# Patient Record
Sex: Male | Born: 1970 | Race: White | Hispanic: No | Smoking: Never smoker
Health system: Southern US, Community
[De-identification: ages and names within clinical notes are randomized; demographics above are authoritative.]

## PROBLEM LIST (undated history)

## (undated) DIAGNOSIS — J45909 Unspecified asthma, uncomplicated: Secondary | ICD-10-CM

## (undated) HISTORY — DX: Unspecified asthma, uncomplicated: J45.909

## (undated) HISTORY — PX: HERNIA REPAIR: SHX51

---

## 2012-10-06 ENCOUNTER — Emergency Department: Payer: Self-pay | Admitting: Emergency Medicine

## 2013-09-17 ENCOUNTER — Ambulatory Visit: Payer: Self-pay | Admitting: General Practice

## 2015-03-25 IMAGING — CR DG RIBS 2V*R*
1 series · 4 of 4 positions shown · non-contrast
Comparison: none

REASON FOR EXAM: pain rt side fax report to Dr. Sanoshi [phone]
COMMENTS:

[Series 1: w ribs ap upper right · 0.14mm/px · 4 of 4 slices shown]
[im 1/4]
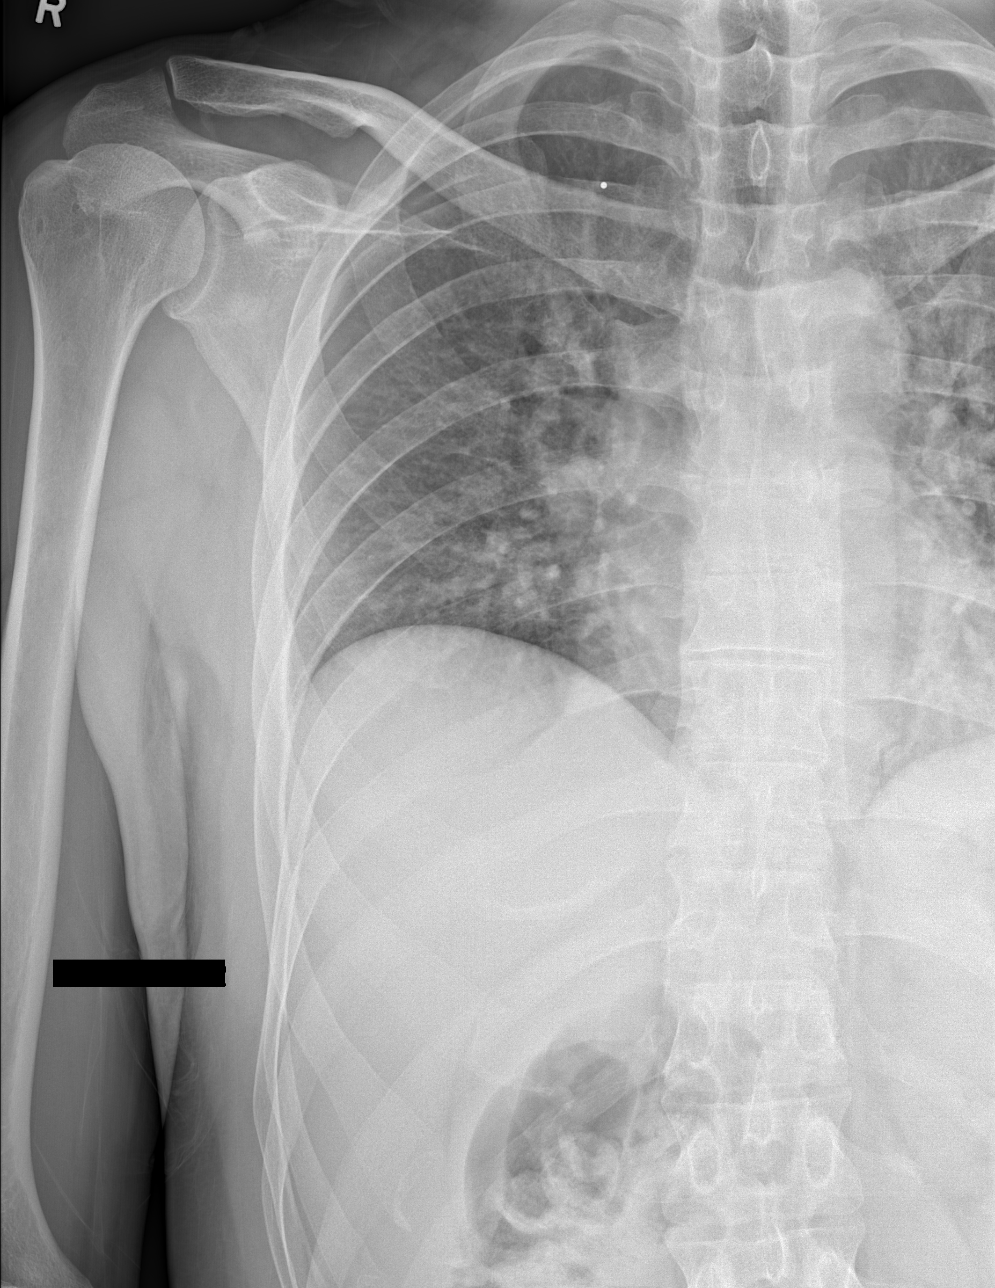
[im 2/4]
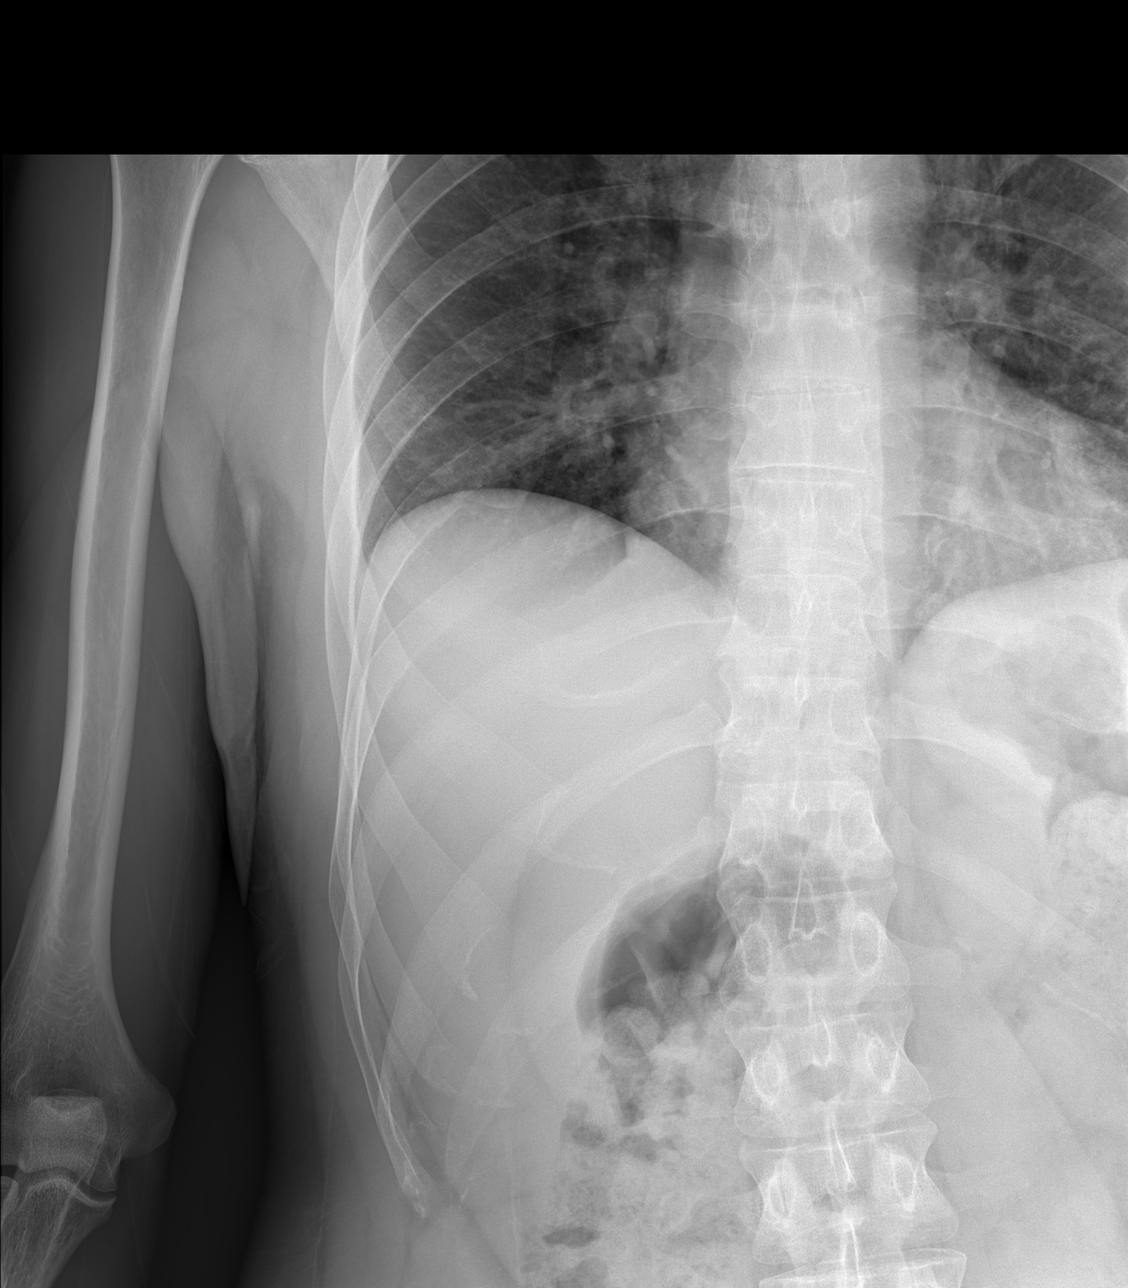
[im 3/4]
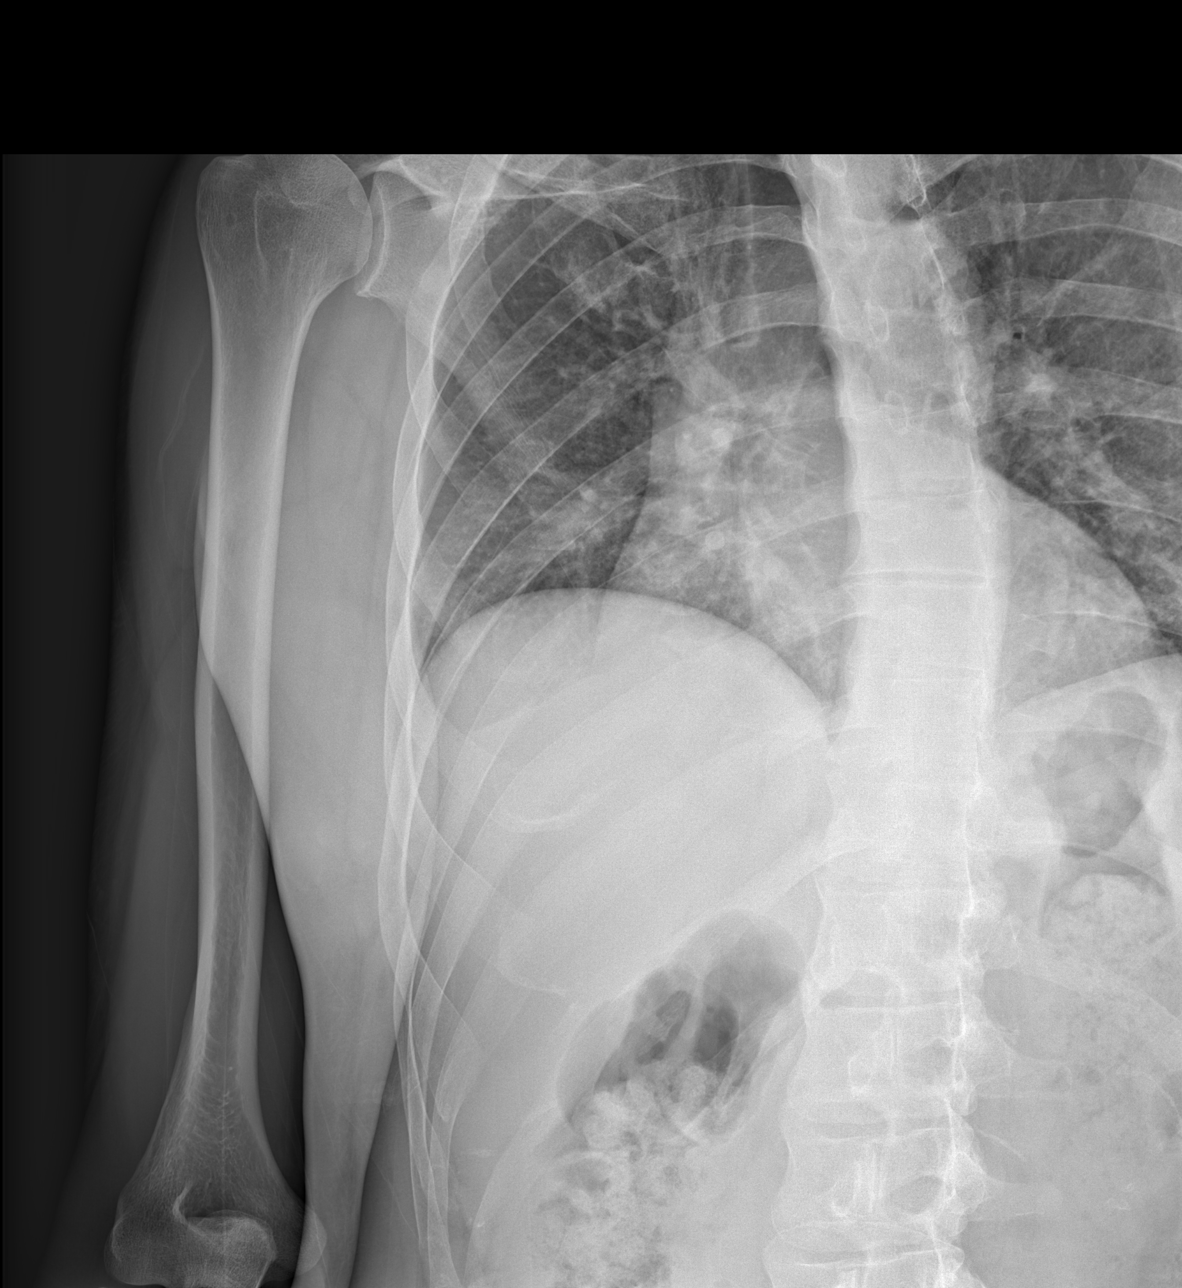
[im 4/4]
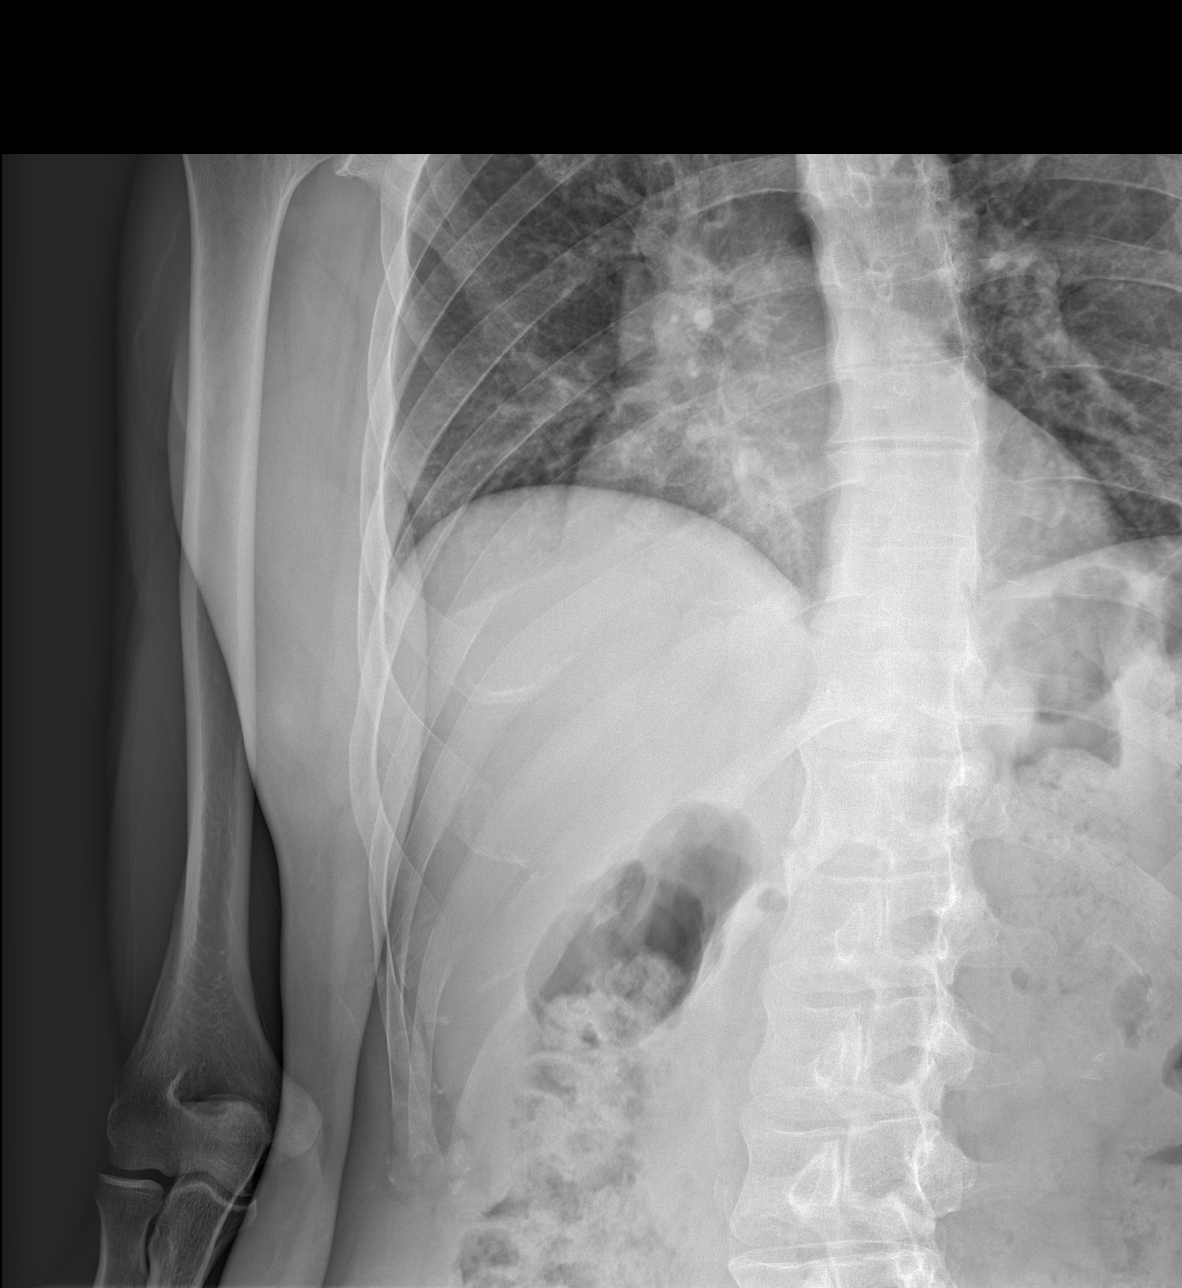

[4 of 4 positions shown; findings below may reference images not displayed]

PROCEDURE:     DXR - DXR RIBS RIGHT UNILATERAL  - September 17, 2013  [DATE]

RESULT:     Four views of the right ribs reveal the bones to be adequately
mineralized. There is no lytic nor blastic lesion nor periosteal reaction.
There is no evidence of an acute or old fracture. No significant pleural
thickening is evident. There is no pleural effusion or pneumothorax.
IMPRESSION: No acute or chronic abnormality of the right ribs is
demonstrated.

[REDACTED]

## 2019-09-03 ENCOUNTER — Ambulatory Visit: Payer: Self-pay

## 2019-09-03 DIAGNOSIS — Z23 Encounter for immunization: Secondary | ICD-10-CM

## 2020-01-13 DIAGNOSIS — D101 Benign neoplasm of tongue: Secondary | ICD-10-CM | POA: Diagnosis not present

## 2020-03-04 ENCOUNTER — Ambulatory Visit: Payer: 59 | Admitting: Registered Nurse

## 2020-03-04 ENCOUNTER — Encounter: Payer: Self-pay | Admitting: Registered Nurse

## 2020-03-04 ENCOUNTER — Other Ambulatory Visit: Payer: Self-pay

## 2020-03-04 DIAGNOSIS — J45909 Unspecified asthma, uncomplicated: Secondary | ICD-10-CM

## 2020-03-04 DIAGNOSIS — J302 Other seasonal allergic rhinitis: Secondary | ICD-10-CM

## 2020-03-04 DIAGNOSIS — M79603 Pain in arm, unspecified: Secondary | ICD-10-CM | POA: Insufficient documentation

## 2020-03-04 DIAGNOSIS — M654 Radial styloid tenosynovitis [de Quervain]: Secondary | ICD-10-CM

## 2020-03-04 MED ORDER — IBUPROFEN 800 MG PO TABS: 800.0000 mg | ORAL_TABLET | Freq: Three times a day (TID) | ORAL | 0 refills | Status: DC

## 2020-03-04 MED ORDER — BIOFREEZE 4 % EX GEL: 1.0000 "application " | Freq: Four times a day (QID) | CUTANEOUS | Status: AC | PRN

## 2020-03-04 NOTE — Patient Instructions (Signed)
De Quervain's Tenosynovitis  De Quervain's tenosynovitis is a condition that causes inflammation of the tendon on the thumb side of the wrist. Tendons are cords of tissue that connect bones to muscles. The tendons in the hand pass through a tunnel called a sheath. A slippery layer of tissue (synovium) lets the tendons move smoothly in the sheath. With de Quervain's tenosynovitis, the sheath swells or thickens, causing friction and pain. The condition is also called de Quervain's disease and de Quervain's syndrome. It occurs most often in women who are 20-40 years old. What are the causes? The exact cause of this condition is not known. It may be associated with overuse of the hand and wrist. What increases the risk?  You are more likely to develop this condition if you:  Use your hands far more than normal, especially if you repeat certain movements that involve twisting your hand or using a tight grip.  Are pregnant.  Are a middle-aged woman.  Have rheumatoid arthritis.  Have diabetes. What are the signs or symptoms? The main symptom of this condition is pain on the thumb side of the wrist. The pain may get worse when you grasp something or turn your wrist. Other symptoms may include:  Pain that extends up the forearm.  Swelling of your wrist and hand.  Trouble moving the thumb and wrist.  A sensation of snapping in the wrist.  A bump filled with fluid (cyst) in the area of the pain. How is this diagnosed? This condition may be diagnosed based on:  Your symptoms and medical history.  A physical exam. During the exam, your health care provider may do a simple test Wynn Maudlin test) that involves pulling your thumb and wrist to see if this causes pain. You may also need to have an X-ray. How is this treated? Treatment for this condition may include:  Avoiding any activity that causes pain and swelling.  Taking medicines. Anti-inflammatory medicines and corticosteroid  injections may be used to reduce inflammation and relieve pain.  Wearing a splint.  Having surgery. This may be needed if other treatments do not work. Once the pain and swelling has gone down:  Physical therapy. This includes stretching and strengthening exercises.  Occupational therapy. This includes adjusting how you move your wrist. Follow these instructions at home: If you have a splint:  Wear the splint as told by your health care provider. Remove it only as told by your health care provider.  Loosen the splint if your fingers tingle, become numb, or turn cold and blue.  Keep the splint clean.  If the splint is not waterproof: ? Do not let it get wet. ? Cover it with a watertight covering when you take a bath or a shower. Managing pain, stiffness, and swelling   Avoid movements and activities that cause pain and swelling in the wrist area.  If directed, put ice on the painful area. This may be helpful after doing activities that involve the sore wrist. ? Put ice in a plastic bag. ? Place a towel between your skin and the bag. ? Leave the ice on for 20 minutes, 2-3 times a day.  Move your fingers often to avoid stiffness and to lessen swelling.  Raise (elevate) the injured area above the level of your heart while you are sitting or lying down. General instructions  Return to your normal activities as told by your health care provider. Ask your health care provider what activities are safe for you.  Take  over-the-counter and prescription medicines only as told by your health care provider.  Keep all follow-up visits as told by your health care provider. This is important. Contact a health care provider if:  Your pain medicine does not help.  Your pain gets worse.  You develop new symptoms. Summary  De Quervain's tenosynovitis is a condition that causes inflammation of the tendon on the thumb side of the wrist.  The condition occurs most often in women who are  38-13 years old.  The exact cause of this condition is not known. It may be associated with overuse of the hand and wrist.  Treatment starts with avoiding activity that causes pain or swelling in the wrist area. Other treatment may include wearing a splint and taking medicine. Sometimes, surgery is needed. This information is not intended to replace advice given to you by your health care provider. Make sure you discuss any questions you have with your health care provider. Document Revised: 05/24/2018 Document Reviewed: 10/30/2017 Elsevier Patient Education  [place].   Wrist and Forearm Exercises Ask your health care provider which exercises are safe for you. Do exercises exactly as told by your health care provider and adjust them as directed. It is normal to feel mild stretching, pulling, tightness, or discomfort as you do these exercises. Stop right away if you feel sudden pain or your pain gets worse. Do not begin these exercises until told by your health care provider. Range-of-motion exercises These exercises warm up your muscles and joints and improve the movement and flexibility of your injured wrist and forearm. These exercises also help to relieve pain, numbness, and tingling. These exercises are done using the muscles in your injured wrist and forearm. Wrist flexion 1. Bend your left / right elbow to a 90-degree angle (right angle) with your palm facing the floor. 2. Bend your wrist so that your fingers point toward the floor (flexion). 3. Hold this position for ____5______ seconds. 4. Slowly return to the starting position. Repeat ___3_______ times. Complete this exercise ____3______ times a day. Wrist extension 1. Bend your left / right elbow to a 90-degree angle (right angle) with your palm facing the floor. 2. Bend your wrist so that your fingers point toward the ceiling (extension). 3. Hold this position for _____5_____ seconds. 4. Slowly return to the starting  position. Repeat ____3______ times. Complete this exercise ____3______ times a day. Ulnar deviation 1. Bend your left / right elbow to a 90-degree angle (right angle), and rest your forearm on a table with your palm facing down. 2. Keeping your hand flat on the table, bend your left /right wrist toward your small finger (pinkie). This is ulnar deviation. 3. Hold this position for _____5_____ seconds. 4. Slowly return to the starting position. Repeat _____3_____ times. Complete this exercise ______3____ times a day. Radial deviation 1. Bend your left / right elbow to a 90-degree angle (right angle), and rest your forearm on a table with your palm facing down. 2. Keeping your hand flat on the table, bend your left /right wrist toward your thumb. This is radial deviation. 3. Hold this position for ____5______ seconds. 4. Slowly return to the starting position. Repeat ______3____ times. Complete this exercise _____3_____ times a day. Forearm rotation, supination  1. Sit with your left / right elbow bent to a 90-degree angle (right angle). Position your forearm so that the thumb is facing the ceiling (neutral position). 2. Turn (rotate) your palm up toward the ceiling (supination), stopping when  you feel a gentle stretch. 3. Hold this position for ___5______ seconds. 4. Slowly return to the starting position. Repeat ___3_______ times. Complete this exercise ___3_______ times a day. Forearm rotation, pronation  1. Sit with your left / right elbow bent to a 90-degree angle (right angle). Position your forearm so that the thumb is facing the ceiling (neutral position). 2. Rotate your palm down toward the floor (pronation), stopping when you feel a gentle stretch. 3. Hold this position for _____5_____ seconds. 4. Slowly return to the starting position. Repeat _____3_____ times. Complete this exercise ___3_______ times a day. Stretching These exercises warm up your muscles and joints and improve  the movement and flexibility of your injured wrist and forearm. These exercises also help to relieve pain, numbness, and tingling. These exercises are done using your healthy wrist and forearm to help stretch the muscles in your injured wrist and forearm. Wrist flexion  1. Extend your left / right arm in front of you, and turn your palm down toward the floor. ? If told by your health care provider, bend your left / right elbow to a 90-degree angle (right angle) at your side. 2. Using your uninjured hand, gently press over the back of your left / right hand to bend your wrist and fingers toward the floor (flexion). Go as far as you can to feel a stretch without causing pain. 3. Hold this position for _____5_____ seconds. 4. Slowly return to the starting position. Repeat ____3______ times. Complete this exercise ________3__ times a day. Wrist extension  1. Extend your left / right arm in front of you and turn your palm up toward the ceiling. ? If told by your health care provider, bend your left / right elbow to a 90-degree angle (right angle) at your side. 2. Using your uninjured hand, gently press over the palm of your left / right hand to bend your wrist and fingers toward the floor (extension). Go as far as you can to feel a stretch without causing pain. 3. Hold this position for ___5_______ seconds. 4. Slowly return to the starting position. Repeat ___3_______ times. Complete this exercise ____3______ times a day. Forearm rotation, supination 1. Sit with your left / right elbow bent to a 90-degree angle (right angle). Position your forearm so that the thumb is facing the ceiling (neutral position). 2. Rotate your palm up toward the ceiling as far as you can on your own (supination). Then, use your uninjured hand to help turn your forearm more, stopping when you feel a gentle stretch. 3. Hold this position for ______5____ seconds. 4. Slowly return to the starting position. Repeat ____3______  times. Complete this exercise _____3_____ times a day. Forearm rotation, pronation 1. Sit with your left / right elbow bent to a 90-degree angle (right angle). Position your forearm so that the thumb is facing the ceiling (neutral position). 2. Rotate your palm down toward the floor as far as you can on your own (pronation). Then, use your uninjured hand to help turn your forearm more, stopping when you feel a gentle stretch. 3. Hold this position for _____5_____ seconds. 4. Slowly return to the starting position. Repeat ____3______ times. Complete this exercise _____3_____ times a day. Strengthening exercises These exercises build strength and endurance in your wrist and forearm. Endurance is the ability to use your muscles for a long time, even after they get tired. Wrist flexion  1. Sit with your left / right forearm supported on a table or other surface. [employer]  your elbow to a 90-degree angle (right angle), and rest your hand palm-up over the edge of the table. 2. Hold a ______none____ weight in your left / right hand. Or, hold an exercise band or tube in both hands, keeping your hands at the same level and hip distance apart. There should be a slight tension in the exercise band or tube. 3. Slowly curl your hand up toward the ceiling (flexion). 4. Hold this position for _______5___ seconds. 5. Slowly lower your hand back to the starting position. Repeat ____3______ times. Complete this exercise _____3_____ times a day. Wrist extension  1. Sit with your left / right forearm supported on a table or other surface. Bend your elbow to a 90-degree angle (right angle), and rest your hand palm-down over the edge of the table. 2. Hold a ____none______ weight in your left / right hand. Or, hold an exercise band or tube in both hands, keeping your hands at the same level and hip distance apart. There should be a slight tension in the exercise band or tube. 3. Slowly curl your hand up toward the ceiling  (extension). 4. Hold this position for ___5______ seconds. 5. Slowly lower your hand back to the starting position. Repeat ____3______ times. Complete this exercise ______3____ times a day. Forearm rotation, supination  1. Sit with your left / right forearm supported on a table or other surface. Bend your elbow to a 90-degree angle (right angle). Position your forearm so that your thumb is facing the ceiling (neutral position) and your hand is resting over the edge of the table. 2. Hold a hammer in your left / right hand. ? This exercise will be easier if you hold the hammer near the head of the hammer. ? This exercise will be harder if you hold the hammer near the end of the handle. 3. Without moving your elbow, slowly rotate your palm up toward the ceiling (supination). 4. Hold this position for __________ seconds. 5. Slowly return to the starting position. Repeat ____3______ times. Complete this exercise _____3_____ times a day. Forearm rotation, pronation  1. Sit with your left / right forearm supported on a table or other surface. Bend your elbow to a 90-degree angle (right angle). Position your forearm so that the thumb is facing the ceiling (neutral position), with your hand resting over the edge of the table. 2. Hold a hammer in your left / right hand. ? This exercise will be easier if you hold the hammer near the head of the hammer. ? This exercise will be harder if you hold the hammer near the end of the handle. 3. Without moving your elbow, slowly rotate your palm down toward the floor (pronation). 4. Hold this position for ___5_______ seconds. 5. Slowly return to the starting position. Repeat _____3_____ times. Complete this exercise _____3_____ times a day. Grip strengthening  1. Grasp a stress ball or other ball in the middle of your left / right hand. Start with your elbow bent to a 90-degree angle (right angle). 2. Slowly increase the pressure, squeezing the ball as hard as  you can without causing pain. ? Think of bringing the tips of your fingers into the middle of your palm. All of your finger joints should bend when doing this exercise. ? To make this exercise harder, gradually try to straighten your elbow in front of you, until you can do the exercise with your elbow fully straight. 3. Hold your squeeze for ____5______ seconds, then relax. If instructed by  your health care provider, do this exercise: ? With your forearm positioned so that the thumb is facing the ceiling (neutral position). ? With your forearm turned palm down. ? With your forearm turned palm up. Repeat _____3_____ times. Complete this exercise _______3___ times a day. This information is not intended to replace advice given to you by your health care provider. Make sure you discuss any questions you have with your health care provider. Document Revised: 01/10/2019 Document Reviewed: 01/10/2019 Elsevier Patient Education  [place].

## 2020-03-04 NOTE — Progress Notes (Signed)
Subjective:    Patient ID: Vernon Wade, male    DOB: 09-17-1971, 49 y.o.   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  Iced right arm yesterday felt better afterwards.  Shoots left handed but right hand dominant.  Patient feels right forearm slightly swollen and very tender to touch.  Denied loss of strength/coordination or trauma/bruising/rash.     Review of Systems  Constitutional: Positive for activity change. Negative for appetite change, chills, diaphoresis, fatigue, fever and unexpected weight change.  HENT: Negative for trouble swallowing and voice change.   Eyes: Negative for photophobia and visual disturbance.  Respiratory: Negative for cough, shortness of breath, wheezing and stridor.   Cardiovascular: Negative for palpitations.  Gastrointestinal: Negative for abdominal pain, diarrhea, nausea and vomiting.  Endocrine: Negative for cold intolerance and heat intolerance.  Genitourinary: Negative for difficulty urinating.  Musculoskeletal: Positive for joint swelling and myalgias. Negative for arthralgias, back pain, gait problem, neck pain and neck stiffness.  Skin: Negative for color change and rash.  Allergic/Immunologic: Positive for environmental allergies. Negative for food allergies.  Neurological: Negative for dizziness, tremors, seizures, syncope, facial asymmetry, speech difficulty, weakness, light-headedness, numbness and headaches.  Hematological: Negative for adenopathy. Does not bruise/bleed easily.  Psychiatric/Behavioral: Negative for agitation, confusion and sleep disturbance.       Objective:   Physical Exam Vitals and nursing note reviewed.   Constitutional:      General: He is awake. He is not in acute distress.    Appearance: Normal appearance. He is well-developed and well-groomed. He is obese. He is not ill-appearing, toxic-appearing or diaphoretic.  HENT:     Head: Normocephalic and atraumatic.     Jaw: There is normal jaw occlusion.     Right Ear: Hearing and external ear normal.     Left Ear: Hearing and external ear normal.     Nose: Nose normal.     Mouth/Throat:     Lips: Pink. No lesions.     Mouth: Mucous membranes are moist. No angioedema.     Pharynx: Oropharynx is clear.  Eyes:     General: Lids are normal. Gaze aligned appropriately. Allergic shiner present. No visual field deficit or scleral icterus.       Right eye: No discharge.        Left eye: No discharge.     Extraocular Movements: Extraocular movements intact.     Conjunctiva/sclera: Conjunctivae normal.     Pupils: Pupils are equal, round, and reactive to light.  Neck:     Thyroid: No thyromegaly.     Trachea: Trachea and phonation normal.  Cardiovascular:     Rate and Rhythm: Normal rate and regular rhythm.     Pulses: Normal pulses.          Radial pulses are 2+ on the right side and 2+ on the left side.     Heart sounds: Normal heart sounds.  Pulmonary:     Effort: Pulmonary effort is normal.     Breath sounds: Normal breath sounds and air entry. No stridor, decreased air movement or transmitted upper airway sounds. No decreased breath sounds or wheezing.     Comments: Wearing cloth mask due to covid  19 pandemic; spoke full sentences without difficulty; no cough observed in exam room Abdominal:     General: Abdomen is flat.     Palpations: Abdomen is soft.  Musculoskeletal:        General: Swelling, tenderness and signs of injury present. No deformity. Normal range of motion.     Right shoulder: Normal.     Left shoulder: Normal.     Right elbow: Normal.     Left elbow: Normal.     Right forearm: Swelling, edema and tenderness  present. No deformity, lacerations or bony tenderness.     Left forearm: Normal. No swelling, edema, deformity, lacerations, tenderness or bony tenderness.     Right wrist: Swelling and tenderness present. No deformity, effusion, lacerations, bony tenderness, snuff box tenderness or crepitus. Normal range of motion. Normal pulse.     Left wrist: Normal. No swelling, deformity, effusion, lacerations, tenderness, bony tenderness, snuff box tenderness or crepitus. Normal range of motion. Normal pulse.     Right hand: Tenderness present. No swelling, deformity, lacerations or bony tenderness. Normal range of motion. Normal strength. Normal sensation. There is no disruption of two-point discrimination. Normal capillary refill. Normal pulse.     Left hand: Normal. No swelling, deformity, lacerations, tenderness or bony tenderness. Normal range of motion. Normal strength. Normal sensation. There is no disruption of two-point discrimination. Normal capillary refill. Normal pulse.       Arms:       Hands:     Cervical back: Normal range of motion and neck supple. No edema, erythema, signs of trauma, rigidity, torticollis or crepitus. No pain with movement. Normal range of motion.     Right lower leg: No edema.     Left lower leg: No edema.     Comments: +finkelsteins right  Lymphadenopathy:     Head:     Right side of head: No submental, submandibular or preauricular adenopathy.     Left side of head: No submental, submandibular or preauricular adenopathy.     Cervical: No cervical adenopathy.     Right cervical: No superficial cervical adenopathy.    Left cervical: No superficial cervical adenopathy.  Skin:    General: Skin is warm and dry.     Capillary Refill: Capillary refill takes less than 2 seconds.     Coloration: Skin is not ashen, cyanotic, jaundiced, mottled, pale or sallow.     Findings: No abrasion, abscess, acne, bruising, burn, ecchymosis, erythema, signs of injury, laceration, lesion,  petechiae, rash or wound.     Nails: There is no clubbing.  Neurological:     General: No focal deficit present.     Mental Status: He is alert and oriented to person, place, and time. Mental status is at baseline.     GCS: GCS eye subscore is 4. GCS verbal subscore is 5. GCS motor subscore is 6.     Cranial Nerves: Cranial nerves are intact. No cranial nerve deficit, dysarthria or facial asymmetry.     Sensory: Sensation is intact. No sensory deficit.     Motor: Motor function is intact. No weakness, tremor, atrophy, abnormal muscle tone or seizure activity.     Coordination: Coordination is intact. Coordination normal.     Gait: Gait is intact. Gait normal.     Comments: Gait sure and steady in hallway; on/off exam table and in/out of chair without difficulty; bilateral hand grasp equal 5/5 and upper extremity  Psychiatric:        Attention and  Perception: Attention and perception normal.        Mood and Affect: Mood and affect normal.        Speech: Speech normal.        Behavior: Behavior normal. Behavior is cooperative.        Thought Content: Thought content normal.        Cognition and Memory: Cognition and memory normal.        Judgment: Judgment normal.     Fitted and distributed thumb spica XL to patient from clinic stock today.  Given 8 UD biofreeze gel from clinic stock also.      Assessment & Plan:  A-right dequervain's tenosynovitis acute initial visit  P-Patient given [employer] handout on dequervain's tenosynovitis with rehab exercises. Repetitive motion injury due to yard work this weekend with river walk/lawnmower/aerator use. Discussed rest, nsaid x 2 weeks motrin 800mg  po TID take with food max dosing OTC NSAID patient reported typically only takes 400mg  per dose,  Gentle arom this week then start exercises on handout in 7 days He has reusable ice pack at home 15 minutes QID prn swelling/pain recommended but encouraged BID after work and evening every day. Follow up in  2 weeks re-evaluation if not improving consider PT/Orthopedics for steroid injection.  Hydrate, hydrate, hydrate, avoid dehydration when taking NSAID. Work restriction note given to patient x 7 days avoid impact to right hand and lifting greater than 5 lbs with right hand.  Patient verbalized understanding of instructions, agreed with plan of care and had no further questions at this time.

## 2020-03-04 NOTE — Progress Notes (Signed)
Right lower arm - hurts when moves thumb around. Started after doing yard work Monday & Tuesday.  Air-rating the yard yesterday & pulling on the machine. Has had Dequervein's before & said this feels like then.  Has elevated some today while at work & has an elastic brace. Ibuprofen 400 mg this morning around 8:30am.  AMD

## 2020-05-18 NOTE — Progress Notes (Signed)
Scheduled to complete physical 05/28/20 - No provider. Needs to reschedule provider appt.  Scheduled to complete physical on 05/29/20 with Randel Pigg, PA-C.  AMD

## 2020-05-19 ENCOUNTER — Other Ambulatory Visit: Payer: Self-pay

## 2020-05-19 ENCOUNTER — Ambulatory Visit: Payer: Self-pay

## 2020-05-19 DIAGNOSIS — Z0000 Encounter for general adult medical examination without abnormal findings: Secondary | ICD-10-CM

## 2020-05-19 LAB — POCT URINALYSIS DIPSTICK
Bilirubin, UA: NEGATIVE
Blood, UA: NEGATIVE
Glucose, UA: NEGATIVE
Ketones, UA: NEGATIVE
Leukocytes, UA: NEGATIVE
Nitrite, UA: NEGATIVE
Protein, UA: NEGATIVE
Spec Grav, UA: 1.01 (ref 1.010–1.025)
Urobilinogen, UA: 0.2 E.U./dL
pH, UA: 7.5 (ref 5.0–8.0)

## 2020-05-20 LAB — CMP12+LP+TP+TSH+6AC+PSA+CBC…
ALT: 13 IU/L (ref 0–44)
AST: 20 IU/L (ref 0–40)
Albumin/Globulin Ratio: 2.1 (ref 1.2–2.2)
Albumin: 4.5 g/dL (ref 4.0–5.0)
Alkaline Phosphatase: 71 IU/L (ref 48–121)
BUN/Creatinine Ratio: 12 (ref 9–20)
BUN: 12 mg/dL (ref 6–24)
Basophils Absolute: 0.1 10*3/uL (ref 0.0–0.2)
Basos: 1 %
Bilirubin Total: 0.5 mg/dL (ref 0.0–1.2)
Calcium: 9.5 mg/dL (ref 8.7–10.2)
Chloride: 105 mmol/L (ref 96–106)
Chol/HDL Ratio: 2.5 ratio (ref 0.0–5.0)
Cholesterol, Total: 153 mg/dL (ref 100–199)
Creatinine, Ser: 1.02 mg/dL (ref 0.76–1.27)
EOS (ABSOLUTE): 0.2 10*3/uL (ref 0.0–0.4)
Eos: 4 %
Estimated CHD Risk: <0.5 times avg. (ref 0.0–1.0)
Free Thyroxine Index: 2.1 (ref 1.2–4.9)
GFR calc Af Amer: 101 mL/min/{1.73_m2} (ref >59)
GFR calc non Af Amer: 87 mL/min/{1.73_m2} (ref >59)
GGT: 39 IU/L (ref 0–65)
Globulin, Total: 2.1 g/dL (ref 1.5–4.5)
Glucose: 81 mg/dL (ref 65–99)
HDL: 61 mg/dL (ref >39)
Hematocrit: 44.6 % (ref 37.5–51.0)
Hemoglobin: 15.5 g/dL (ref 13.0–17.7)
Immature Grans (Abs): 0 10*3/uL (ref 0.0–0.1)
Immature Granulocytes: 0 %
Iron: 73 ug/dL (ref 38–169)
LDH: 201 IU/L (ref 121–224)
LDL Chol Calc (NIH): 79 mg/dL (ref 0–99)
Lymphocytes Absolute: 1.6 10*3/uL (ref 0.7–3.1)
Lymphs: 28 %
MCH: 31.4 pg (ref 26.6–33.0)
MCHC: 34.8 g/dL (ref 31.5–35.7)
MCV: 91 fL (ref 79–97)
Monocytes Absolute: 0.5 10*3/uL (ref 0.1–0.9)
Monocytes: 8 %
Neutrophils Absolute: 3.3 10*3/uL (ref 1.4–7.0)
Neutrophils: 59 %
Phosphorus: 2.6 mg/dL — ABNORMAL LOW (ref 2.8–4.1)
Platelets: 255 10*3/uL (ref 150–450)
Potassium: 4.5 mmol/L (ref 3.5–5.2)
Prostate Specific Ag, Serum: 0.2 ng/mL (ref 0.0–4.0)
RBC: 4.93 x10E6/uL (ref 4.14–5.80)
RDW: 13 % (ref 11.6–15.4)
Sodium: 141 mmol/L (ref 134–144)
T3 Uptake Ratio: 27 % (ref 24–39)
T4, Total: 7.8 ug/dL (ref 4.5–12.0)
TSH: 2.03 u[IU]/mL (ref 0.450–4.500)
Total Protein: 6.6 g/dL (ref 6.0–8.5)
Triglycerides: 63 mg/dL (ref 0–149)
Uric Acid: 6.3 mg/dL (ref 3.8–8.4)
VLDL Cholesterol Cal: 13 mg/dL (ref 5–40)
WBC: 5.6 10*3/uL (ref 3.4–10.8)

## 2020-05-25 ENCOUNTER — Encounter: Payer: Self-pay | Admitting: Emergency Medicine

## 2020-06-01 ENCOUNTER — Ambulatory Visit: Payer: Self-pay | Admitting: Emergency Medicine

## 2020-06-01 ENCOUNTER — Other Ambulatory Visit: Payer: Self-pay

## 2020-06-01 VITALS — BP 142/82 | HR 72 | Temp 97.0°F | Resp 14 | Ht 73.0 in | Wt 239.0 lb

## 2020-06-01 DIAGNOSIS — Z0000 Encounter for general adult medical examination without abnormal findings: Secondary | ICD-10-CM

## 2020-06-01 NOTE — Progress Notes (Signed)
  I have reviewed the triage vital signs and the nursing notes.   HISTORY  Chief Complaint No chief complaint on file.  HPI Vernon Wade is a 49 y.o. male is here for annual physical.  No complaints.      No past medical history on file.  Patient Active Problem List   Diagnosis Date Noted  . Arm pain 03/04/2020  . Seasonal allergies 03/04/2020  . Reactive airway disease 03/04/2020    No past surgical history on file.  Prior to Admission medications   Medication Sig Start Date End Date Taking? Authorizing Provider  diphenhydrAMINE HCl (BENADRYL ALLERGY PO) Take 1 tablet by mouth as needed (seasonal allergies).    [provider]  ibuprofen (ADVIL) 800 MG tablet Take 1 tablet (800 mg total) by mouth 3 (three) times daily. 03/04/20   Betancourt, Aura Fey, NP    Allergies Patient has no known allergies.  No family history on file.  Social History Social History   Tobacco Use  . Smoking status: Never Smoker  . Smokeless tobacco: Never Used  Substance Use Topics  . Alcohol use: Not on file  . Drug use: Not on file    Review of Systems Constitutional: No fever/chills Eyes: No visual changes. ENT: No complaints. Cardiovascular: Denies chest pain. Respiratory: Denies shortness of breath. Gastrointestinal: No abdominal pain.  Musculoskeletal: Negative for joint or muscle pain. Skin: Negative for rash. Neurological: Negative for headaches, focal weakness or numbness.  ____________________________________________   PHYSICAL EXAM:  Constitutional: Alert and oriented. Well appearing and in no acute distress. Eyes: Conjunctivae are normal. PERRL. EOMI. Head: Atraumatic. Nose: No congestion/rhinnorhea. Neck: No stridor.   Cardiovascular: Normal rate, regular rhythm. Grossly normal heart sounds.  Good peripheral circulation. Respiratory: Normal respiratory effort.  No retractions. Lungs CTAB. Gastrointestinal: Soft and nontender. No  distention. Musculoskeletal: Nontender thoracic and lumbar spine to palpation.  Moves upper and lower extremities with any difficulty normal gait was noted. Neurologic:  Normal speech and language. No gross focal neurologic deficits are appreciated.  Skin:  Skin is warm, dry and intact. No rash noted. Psychiatric: Mood and affect are normal. Speech and behavior are normal.  ____________________________________________   LABS (all labs ordered are listed, but only abnormal results are displayed)  Discussed labs with patient. ____________________________________________  EKG  Sinus rhythm with ventricular rate of 75. ____________________________________________  ____________________________________________   FINAL CLINICAL IMPRESSION(S)   Annual physical exam.   ED Discharge Orders         Ordered    EKG 12-Lead        06/01/20 1554           Note:  This document was prepared using Dragon voice recognition software and may include unintentional dictation errors.

## 2020-06-07 DIAGNOSIS — J029 Acute pharyngitis, unspecified: Secondary | ICD-10-CM | POA: Diagnosis not present

## 2020-06-07 DIAGNOSIS — H6692 Otitis media, unspecified, left ear: Secondary | ICD-10-CM | POA: Diagnosis not present

## 2020-06-07 DIAGNOSIS — H9202 Otalgia, left ear: Secondary | ICD-10-CM | POA: Diagnosis not present

## 2020-06-07 DIAGNOSIS — J069 Acute upper respiratory infection, unspecified: Secondary | ICD-10-CM | POA: Diagnosis not present

## 2020-06-07 DIAGNOSIS — R6883 Chills (without fever): Secondary | ICD-10-CM | POA: Diagnosis not present

## 2020-06-07 DIAGNOSIS — R05 Cough: Secondary | ICD-10-CM | POA: Diagnosis not present

## 2020-08-24 DIAGNOSIS — R69 Illness, unspecified: Secondary | ICD-10-CM | POA: Diagnosis not present

## 2020-08-24 DIAGNOSIS — Z23 Encounter for immunization: Secondary | ICD-10-CM | POA: Diagnosis not present

## 2020-09-16 ENCOUNTER — Other Ambulatory Visit: Payer: Self-pay

## 2020-09-16 DIAGNOSIS — Z1152 Encounter for screening for COVID-19: Secondary | ICD-10-CM

## 2020-09-16 NOTE — Progress Notes (Signed)
Covid testing - exposure 09/12/20 Vax'ed

## 2020-09-17 LAB — SARS-COV-2, NAA 2 DAY TAT

## 2020-09-17 LAB — NOVEL CORONAVIRUS, NAA: SARS-CoV-2, NAA: NOT DETECTED

## 2020-10-23 DIAGNOSIS — R69 Illness, unspecified: Secondary | ICD-10-CM | POA: Diagnosis not present

## 2020-10-23 DIAGNOSIS — M542 Cervicalgia: Secondary | ICD-10-CM | POA: Diagnosis not present

## 2020-11-02 ENCOUNTER — Ambulatory Visit: Payer: Self-pay | Admitting: Nurse Practitioner

## 2020-11-02 ENCOUNTER — Encounter: Payer: Self-pay | Admitting: Nurse Practitioner

## 2020-11-02 ENCOUNTER — Other Ambulatory Visit: Payer: Self-pay

## 2020-11-02 VITALS — BP 142/83 | HR 89 | Temp 97.9°F | Resp 16 | Ht 73.0 in | Wt 240.0 lb

## 2020-11-02 DIAGNOSIS — M62838 Other muscle spasm: Secondary | ICD-10-CM

## 2020-11-02 DIAGNOSIS — M545 Low back pain, unspecified: Secondary | ICD-10-CM

## 2020-11-02 MED ORDER — CYCLOBENZAPRINE HCL 10 MG PO TABS: 10.0000 mg | ORAL_TABLET | Freq: Three times a day (TID) | ORAL | 0 refills | Status: DC | PRN

## 2020-11-02 MED ORDER — IBUPROFEN 800 MG PO TABS: 800.0000 mg | ORAL_TABLET | Freq: Three times a day (TID) | ORAL | 0 refills | Status: DC
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
## 2020-11-02 NOTE — Progress Notes (Signed)
Forms completed for Worker's Comp and Work note for 48 hour release.

## 2020-11-02 NOTE — Progress Notes (Signed)
Pt presents today with pulled muscle in lower back this morning at work. CL,RMA

## 2020-11-04 ENCOUNTER — Other Ambulatory Visit: Payer: Self-pay | Admitting: Nurse Practitioner

## 2020-11-04 ENCOUNTER — Telehealth: Payer: Self-pay

## 2020-11-04 DIAGNOSIS — M545 Low back pain, unspecified: Secondary | ICD-10-CM

## 2020-11-04 DIAGNOSIS — S39012S Strain of muscle, fascia and tendon of lower back, sequela: Secondary | ICD-10-CM

## 2020-11-04 MED ORDER — PREDNISONE 10 MG (21) PO TBPK: ORAL_TABLET | ORAL | 0 refills | Status: DC

## 2020-11-04 NOTE — Progress Notes (Signed)
Spoke with patient on the phone. He is continuing to feel decreased range of motion. Unable to put belt on without pain. He has been off duty for two days now, pain is controlled but tightness and muscle discomfort are continuing to limit movements.   Discussed changing from ibuprofen to prednisone. Patient is agreeable. Will start Prednisone taper today, stop ibuprofen may use tylenol for pain control and may still use Flexeril as needed in the evenings.   Discussed taking prednisone with food.   He will attempt returning to light duty tomorrow, if unable to perform light duty will call [facility] for release from work until he can be evaluated in clinic.   Instructed to call for In person appointment Monday 11/09/20 at the [facility].   Meds ordered this encounter  Medications  . predniSONE (STERAPRED UNI-PAK 21 TAB) 10 MG (21) TBPK tablet    Sig: Take 6 tablets on day one, 5 on day two, 4 on day three, 3 on day four, 2 on day five, and 1 on day six. Take with food.    Dispense:  21 tablet    Refill:  0

## 2020-11-04 NOTE — Progress Notes (Signed)
Ibuprofen is helping.  Muscle relaxer isn't helping.  Hurts to stand, hurts to sit.  Couldn't put on gun belt.  Can't stand more than 5-10 mins.  Can walk around the house. Denies pain radiating down the legs.  Pain is localized to the low back to either side of spine.  Discussed findings with Apolonio Schneiders, FNP and she will call in steroid pack.  Employee will pick up StopPain topical rub and ice packs from clinic.  Employee to return to work on 11/05/20.  Desk duty only. Employee is to wear street clothes.  Cannot wear gun belt Cannot stand more than 15 mins.  Follow up appoint on Monday Dec 6th.  Per Apolonio Schneiders, FNP  Called Employee with instructions on restrictions.  Employee verbalized understanding.

## 2020-11-09 ENCOUNTER — Ambulatory Visit: Payer: Self-pay | Admitting: Physician Assistant

## 2020-11-09 ENCOUNTER — Other Ambulatory Visit: Payer: Self-pay

## 2020-11-09 ENCOUNTER — Encounter: Payer: Self-pay | Admitting: Physician Assistant

## 2020-11-09 VITALS — BP 130/82 | HR 80 | Temp 98.4°F | Resp 16 | Ht 73.0 in | Wt 230.0 lb

## 2020-11-09 DIAGNOSIS — S39012S Strain of muscle, fascia and tendon of lower back, sequela: Secondary | ICD-10-CM

## 2020-11-09 MED ORDER — ORPHENADRINE CITRATE ER 100 MG PO TB12: 100.0000 mg | ORAL_TABLET | Freq: Two times a day (BID) | ORAL | 0 refills | Status: DC

## 2020-11-09 MED ORDER — LIDOCAINE 5 % EX PTCH: 1.0000 | MEDICATED_PATCH | CUTANEOUS | 0 refills | Status: DC

## 2020-11-09 NOTE — Progress Notes (Signed)
Pt states the medication that was prescribed has helped him and he's getting better. On a pain scale pt states 2. He's doing a lot better he says. CL,RMA

## 2020-11-09 NOTE — Progress Notes (Signed)
   Subjective: Low back pain    Patient ID: Vernon Wade, male    DOB: Jun 09, 1971, 49 y.o.   MRN: [id number]  HPI Patient is here for reevaluation of back pain.  Patient stated multiple improvement after starting steroid Dosepak.  Patient continues to have pain mostly on the right side which is increased with left lateral movements.  Patient describes the pain as "achy/spasmatic".  No radicular component to pain.   Review of Systems Negative except for complaint.    Objective:   Physical Exam  No acute distress.  No obvious deformity to the lumbar spine.  Patient has decreased range of motion with left lateral movements.  Patient negative straight leg test in supine position.      Assessment & Plan: Resolving lumbar strain.  Patient given discharge care instructions to return to light duty.  Patient given a prescription for Norflex and Lidoderm patches.  Patient will return back on 11/13/2020 for evaluation for full duty.

## 2020-11-13 ENCOUNTER — Other Ambulatory Visit: Payer: Self-pay

## 2020-11-13 ENCOUNTER — Ambulatory Visit: Payer: Self-pay | Admitting: Physician Assistant

## 2020-11-13 ENCOUNTER — Encounter: Payer: Self-pay | Admitting: Physician Assistant

## 2020-11-13 VITALS — BP 141/88 | HR 92 | Temp 98.2°F | Resp 14 | Ht 74.0 in | Wt 235.0 lb

## 2020-11-13 DIAGNOSIS — S39012S Strain of muscle, fascia and tendon of lower back, sequela: Secondary | ICD-10-CM

## 2020-11-13 NOTE — Progress Notes (Signed)
   Subjective: Lumbar strain    Patient ID: Vernon Wade, male    DOB: 09-07-1971, 49 y.o.   MRN: [id number]  HPI Patient presents for reevaluation lumbar strain.  Patient state feeling better status post new muscle relaxers and Lidoderm patches.  Patient states took the COVID-19 booster shot yesterday no weakness morning with body aches.  Patient denies radicular component to his back pain.  Patient denies bladder bowel dysfunction.  Patient rates his pain as a 2/10.   Review of Systems Lumbar strain    Objective:   Physical Exam  No acute distress.  No obvious lumbar spine deformity.  Patient has full and equal range of motion of the lumbar spine.  No visible or palpable muscle spasm.      Assessment & Plan: Resolved lumbar strain.  Patient return back to full duties.

## 2020-11-13 NOTE — Progress Notes (Signed)
Pt states he's doing a lot better.

## 2020-11-16 DIAGNOSIS — R071 Chest pain on breathing: Secondary | ICD-10-CM | POA: Diagnosis not present

## 2020-11-16 DIAGNOSIS — J90 Pleural effusion, not elsewhere classified: Secondary | ICD-10-CM | POA: Diagnosis not present

## 2020-11-16 DIAGNOSIS — R079 Chest pain, unspecified: Secondary | ICD-10-CM | POA: Diagnosis not present

## 2020-11-16 DIAGNOSIS — J9811 Atelectasis: Secondary | ICD-10-CM | POA: Diagnosis not present

## 2020-11-16 DIAGNOSIS — R918 Other nonspecific abnormal finding of lung field: Secondary | ICD-10-CM | POA: Diagnosis not present

## 2020-11-16 DIAGNOSIS — R0602 Shortness of breath: Secondary | ICD-10-CM | POA: Diagnosis not present

## 2020-11-21 DIAGNOSIS — J90 Pleural effusion, not elsewhere classified: Secondary | ICD-10-CM | POA: Insufficient documentation

## 2020-12-08 DIAGNOSIS — S46819D Strain of other muscles, fascia and tendons at shoulder and upper arm level, unspecified arm, subsequent encounter: Secondary | ICD-10-CM | POA: Diagnosis not present

## 2020-12-08 DIAGNOSIS — M4802 Spinal stenosis, cervical region: Secondary | ICD-10-CM | POA: Diagnosis not present

## 2020-12-08 DIAGNOSIS — N529 Male erectile dysfunction, unspecified: Secondary | ICD-10-CM | POA: Diagnosis not present

## 2020-12-08 DIAGNOSIS — M542 Cervicalgia: Secondary | ICD-10-CM | POA: Diagnosis not present

## 2021-01-05 DIAGNOSIS — Z79899 Other long term (current) drug therapy: Secondary | ICD-10-CM | POA: Diagnosis not present

## 2021-01-05 DIAGNOSIS — M7989 Other specified soft tissue disorders: Secondary | ICD-10-CM | POA: Diagnosis not present

## 2021-01-05 DIAGNOSIS — M79604 Pain in right leg: Secondary | ICD-10-CM | POA: Diagnosis not present

## 2021-01-05 DIAGNOSIS — Z885 Allergy status to narcotic agent status: Secondary | ICD-10-CM | POA: Diagnosis not present

## 2021-01-05 DIAGNOSIS — R2241 Localized swelling, mass and lump, right lower limb: Secondary | ICD-10-CM | POA: Diagnosis not present

## 2021-01-05 DIAGNOSIS — R918 Other nonspecific abnormal finding of lung field: Secondary | ICD-10-CM | POA: Diagnosis not present

## 2021-01-06 DIAGNOSIS — R918 Other nonspecific abnormal finding of lung field: Secondary | ICD-10-CM | POA: Diagnosis not present

## 2021-01-14 ENCOUNTER — Ambulatory Visit: Payer: Self-pay | Admitting: Adult Medicine

## 2021-01-14 ENCOUNTER — Other Ambulatory Visit: Payer: Self-pay

## 2021-01-14 VITALS — BP 135/82 | HR 91 | Temp 98.6°F | Resp 14 | Ht 73.0 in | Wt 235.0 lb

## 2021-01-14 DIAGNOSIS — N400 Enlarged prostate without lower urinary tract symptoms: Secondary | ICD-10-CM

## 2021-01-14 DIAGNOSIS — J90 Pleural effusion, not elsewhere classified: Secondary | ICD-10-CM

## 2021-01-14 NOTE — Progress Notes (Signed)
Pt states righ leg has been swelling since 01/08/21 from hip to toes. Pt states he does have numbness and tingling. Pt states he went to [facility] and they ultra sound, xray and CT. Everything came back normal. CL,RMA

## 2021-01-14 NOTE — Progress Notes (Signed)
I have reviewed the triage vital signs and the nursing notes.   HISTORY  Chief Complaint Leg Swelling  HPI Vernon Wade is a 50 y.o. male     01/05/2021 Leg Pain,Non-Traumatic Acute and obvious right leg swelling and pain that started today     Past Medical History:  Diagnosis Date  . Asthma     Patient Active Problem List   Diagnosis Date Noted  . 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 Pleural effusion on left 11/21/2020  . Arm pain 03/04/2020  . Seasonal allergies 03/04/2020  . Reactive airway disease 03/04/2020    Past Surgical History:  Procedure Laterality Date  . HERNIA REPAIR     when pt was born   Medication Sig Start Date End Date Taking?  albuterol (VENTOLIN HFA) 108 (90 Base) MCG/ACT inhaler Inhale into the lungs. 06/05/20  Yes  buPROPion (WELLBUTRIN XL) 150 MG 24 hr tablet Take 1 tablet by mouth daily. 10/15/20  Yes  furosemide (LASIX) 20 MG tablet Take 20 mg by mouth once. 01/06/21  Yes  ibuprofen (ADVIL) 800 MG tablet Take 1 tablet (800 mg total) by mouth 3 (three) times daily. 11/02/20  Yes  lidocaine (LIDODERM) 5 % Place 1 patch onto the skin daily. Remove & Discard patch within 12 hours or as directed by MD 11/09/20  Yes    Allergies Codeine  Family History  Problem Relation Age of Onset  . Hypertension Maternal Grandmother   . Hypertension Paternal Grandmother     Social History Social History   Tobacco Use  . Smoking status: Never Smoker  . Smokeless tobacco: Never Used  Substance Use Topics  . Alcohol use: Yes    Review of Systems Constitutional: No fever/chills Eyes: No visual changes. ENT: No sore throat. Cardiovascular: hx lt lat chest pain seen in ED  Respiratory: Denies shortness of breath. Gastrointestinal: No abdominal pain.  No nausea, no vomiting.  No diarrhea.  No constipation. Genitourinary: Negative for dysuria. Treated for ED nov dec 2021 Musculoskeletal: Negative for back pain. Skin: Negative for rash. No obvious  insects Neurological: Negative for headaches, focal weakness or numbness. Psychiatric: sullen mood, clear and concerned  ____________________________________________   PHYSICAL EXAM:  VITAL SIGNS: Bmi 31%  135/82 Constitutional: Alert and oriented. Well appearing with clear General above the knee swelling Rt leg. Eyes: Conjunctivae are normal. PERRL. EOMI. Head: Atraumatic. Nose: No congestion/rhinnorhea. Mouth/Throat: Mucous membranes are moist.  Oropharynx non-erythematous. Neck: No stridor.  No cervical spine tenderness to palpation. Lymphatic/Immunilogical: No cervical inguinal lymphadenopathy. Cardiovascular: Normal rate, regular rhythm. Grossly normal heart sounds.  Good peripheral circulation. Respiratory: Normal respiratory effort.  No retractions. Lungs CTAB. +a to e change @ lt axilliary line Gastrointestinal: Soft and nontender. No distention. No abdominal bruits. No CVA tenderness. Genitourinary: significant prostrate hard enlarged 4x5 unable to reach superior pole, tender _0 /10 Musculoskeletal: full Rom 5/5 strength Extremity: No lower extremity tenderness nor edema.  Sl rt knee joint effusion. +sl crepitus, tenderness med>lat collateral lig.  Neurologic:  Normal speech and language. No gross focal neurologic deficits are appreciated. No gait instability. Skin:  Skin is warm, dry and intact. No rash noted. Psychiatric: Mood and affect are normal. Speech and behavior are normal. ____________________________________________   LABS  RADIOLOGY 11/16/2020 Small left partially loculated, minimally complex pleural effusion. Fluid loculated within the left major fissure corresponds to the opacity noted on recent chest radiograph. No superimposed focal pulmonary infiltrate or solid pulmonary mass identified.    ____________________________________________   INITIAL IMPRESSION /  ASSESSMENT Prostrate enlargement r/o malignancy Internal derangement Rt knee, old Pulm Lt  lung lucency  If poss. Stat psa, quantiferon, esr:   Scheduled next wed for pulm surgeon review for percutaneous skinny needle biopgy

## 2021-01-16 ENCOUNTER — Encounter: Payer: Self-pay | Admitting: Adult Medicine

## 2021-01-16 DIAGNOSIS — N393 Stress incontinence (female) (male): Secondary | ICD-10-CM | POA: Diagnosis not present

## 2021-01-16 DIAGNOSIS — M25561 Pain in right knee: Secondary | ICD-10-CM | POA: Diagnosis not present

## 2021-01-16 DIAGNOSIS — R600 Localized edema: Secondary | ICD-10-CM | POA: Diagnosis not present

## 2021-01-16 LAB — ELECTROLYTE PANEL
CO2: 19 mmol/L — ABNORMAL LOW (ref 20–29)
Chloride: 103 mmol/L (ref 96–106)
Potassium: 3.9 mmol/L (ref 3.5–5.2)
Sodium: 138 mmol/L (ref 134–144)

## 2021-01-16 LAB — PSA, TOTAL AND FREE
PSA, Free Pct: >40 %
PSA, Free: 0.04 ng/mL
Prostate Specific Ag, Serum: <0.1 ng/mL (ref 0.0–4.0)

## 2021-01-16 LAB — QUANTIFERON-TB GOLD PLUS
QuantiFERON Mitogen Value: >10 IU/mL
QuantiFERON Nil Value: 0.01 IU/mL
QuantiFERON TB1 Ag Value: 0.06 IU/mL
QuantiFERON TB2 Ag Value: 0.01 IU/mL
QuantiFERON-TB Gold Plus: NEGATIVE

## 2021-01-16 LAB — SEDIMENTATION RATE

## 2021-01-18 ENCOUNTER — Encounter: Payer: Self-pay | Admitting: Adult Medicine

## 2021-01-20 DIAGNOSIS — Z01818 Encounter for other preprocedural examination: Secondary | ICD-10-CM | POA: Diagnosis not present

## 2021-01-20 DIAGNOSIS — R918 Other nonspecific abnormal finding of lung field: Secondary | ICD-10-CM | POA: Diagnosis not present

## 2021-01-21 DIAGNOSIS — M25561 Pain in right knee: Secondary | ICD-10-CM | POA: Diagnosis not present

## 2021-01-23 DIAGNOSIS — C496 Malignant neoplasm of connective and soft tissue of trunk, unspecified: Secondary | ICD-10-CM | POA: Diagnosis not present

## 2021-01-27 DIAGNOSIS — C61 Malignant neoplasm of prostate: Secondary | ICD-10-CM | POA: Insufficient documentation

## 2021-01-27 DIAGNOSIS — C499 Malignant neoplasm of connective and soft tissue, unspecified: Secondary | ICD-10-CM | POA: Diagnosis not present

## 2021-01-27 DIAGNOSIS — R69 Illness, unspecified: Secondary | ICD-10-CM | POA: Diagnosis not present

## 2021-01-27 DIAGNOSIS — R972 Elevated prostate specific antigen [PSA]: Secondary | ICD-10-CM | POA: Diagnosis not present

## 2021-01-27 DIAGNOSIS — R918 Other nonspecific abnormal finding of lung field: Secondary | ICD-10-CM | POA: Diagnosis not present

## 2021-01-29 DIAGNOSIS — C7802 Secondary malignant neoplasm of left lung: Secondary | ICD-10-CM | POA: Diagnosis not present

## 2021-02-02 DIAGNOSIS — C61 Malignant neoplasm of prostate: Secondary | ICD-10-CM | POA: Diagnosis not present

## 2021-02-08 DIAGNOSIS — C496 Malignant neoplasm of connective and soft tissue of trunk, unspecified: Secondary | ICD-10-CM | POA: Diagnosis not present

## 2021-02-10 DIAGNOSIS — Z79899 Other long term (current) drug therapy: Secondary | ICD-10-CM | POA: Diagnosis not present

## 2021-02-10 DIAGNOSIS — C499 Malignant neoplasm of connective and soft tissue, unspecified: Secondary | ICD-10-CM | POA: Diagnosis not present

## 2021-02-10 DIAGNOSIS — I8290 Acute embolism and thrombosis of unspecified vein: Secondary | ICD-10-CM | POA: Diagnosis not present

## 2021-02-10 DIAGNOSIS — C7802 Secondary malignant neoplasm of left lung: Secondary | ICD-10-CM | POA: Diagnosis not present

## 2021-02-10 DIAGNOSIS — C7989 Secondary malignant neoplasm of other specified sites: Secondary | ICD-10-CM | POA: Diagnosis not present

## 2021-02-10 DIAGNOSIS — C7801 Secondary malignant neoplasm of right lung: Secondary | ICD-10-CM | POA: Diagnosis not present

## 2021-02-10 DIAGNOSIS — C61 Malignant neoplasm of prostate: Secondary | ICD-10-CM | POA: Diagnosis not present

## 2021-02-18 DIAGNOSIS — C499 Malignant neoplasm of connective and soft tissue, unspecified: Secondary | ICD-10-CM | POA: Diagnosis not present

## 2021-02-18 DIAGNOSIS — Z79899 Other long term (current) drug therapy: Secondary | ICD-10-CM | POA: Diagnosis not present

## 2021-02-18 DIAGNOSIS — Z0189 Encounter for other specified special examinations: Secondary | ICD-10-CM | POA: Diagnosis not present

## 2021-02-22 DIAGNOSIS — C499 Malignant neoplasm of connective and soft tissue, unspecified: Secondary | ICD-10-CM | POA: Diagnosis not present

## 2021-02-23 DIAGNOSIS — C499 Malignant neoplasm of connective and soft tissue, unspecified: Secondary | ICD-10-CM | POA: Diagnosis not present

## 2021-02-24 DIAGNOSIS — I8290 Acute embolism and thrombosis of unspecified vein: Secondary | ICD-10-CM | POA: Diagnosis not present

## 2021-02-24 DIAGNOSIS — Z7901 Long term (current) use of anticoagulants: Secondary | ICD-10-CM | POA: Diagnosis not present

## 2021-02-24 DIAGNOSIS — C7802 Secondary malignant neoplasm of left lung: Secondary | ICD-10-CM | POA: Diagnosis not present

## 2021-02-24 DIAGNOSIS — C7989 Secondary malignant neoplasm of other specified sites: Secondary | ICD-10-CM | POA: Diagnosis not present

## 2021-02-24 DIAGNOSIS — Z5111 Encounter for antineoplastic chemotherapy: Secondary | ICD-10-CM | POA: Diagnosis not present

## 2021-02-24 DIAGNOSIS — Z79899 Other long term (current) drug therapy: Secondary | ICD-10-CM | POA: Diagnosis not present

## 2021-02-24 DIAGNOSIS — C499 Malignant neoplasm of connective and soft tissue, unspecified: Secondary | ICD-10-CM | POA: Diagnosis not present

## 2021-02-24 DIAGNOSIS — C61 Malignant neoplasm of prostate: Secondary | ICD-10-CM | POA: Diagnosis not present

## 2021-02-24 DIAGNOSIS — R600 Localized edema: Secondary | ICD-10-CM | POA: Diagnosis not present

## 2021-02-25 DIAGNOSIS — C7802 Secondary malignant neoplasm of left lung: Secondary | ICD-10-CM | POA: Insufficient documentation

## 2021-02-25 DIAGNOSIS — C499 Malignant neoplasm of connective and soft tissue, unspecified: Secondary | ICD-10-CM | POA: Insufficient documentation

## 2021-03-03 DIAGNOSIS — C7989 Secondary malignant neoplasm of other specified sites: Secondary | ICD-10-CM | POA: Diagnosis not present

## 2021-03-03 DIAGNOSIS — C499 Malignant neoplasm of connective and soft tissue, unspecified: Secondary | ICD-10-CM | POA: Diagnosis not present

## 2021-03-03 DIAGNOSIS — R7989 Other specified abnormal findings of blood chemistry: Secondary | ICD-10-CM | POA: Diagnosis not present

## 2021-03-03 DIAGNOSIS — M7989 Other specified soft tissue disorders: Secondary | ICD-10-CM | POA: Diagnosis not present

## 2021-03-03 DIAGNOSIS — R5383 Other fatigue: Secondary | ICD-10-CM | POA: Diagnosis not present

## 2021-03-03 DIAGNOSIS — R110 Nausea: Secondary | ICD-10-CM | POA: Diagnosis not present

## 2021-03-04 DIAGNOSIS — C499 Malignant neoplasm of connective and soft tissue, unspecified: Secondary | ICD-10-CM | POA: Diagnosis not present

## 2021-03-09 DIAGNOSIS — C499 Malignant neoplasm of connective and soft tissue, unspecified: Secondary | ICD-10-CM | POA: Diagnosis not present

## 2021-03-11 DIAGNOSIS — Z01818 Encounter for other preprocedural examination: Secondary | ICD-10-CM | POA: Diagnosis not present

## 2021-03-11 DIAGNOSIS — Y832 Surgical operation with anastomosis, bypass or graft as the cause of abnormal reaction of the patient, or of later complication, without mention of misadventure at the time of the procedure: Secondary | ICD-10-CM | POA: Diagnosis not present

## 2021-03-11 DIAGNOSIS — C7802 Secondary malignant neoplasm of left lung: Secondary | ICD-10-CM | POA: Diagnosis not present

## 2021-03-11 DIAGNOSIS — Z452 Encounter for adjustment and management of vascular access device: Secondary | ICD-10-CM | POA: Diagnosis not present

## 2021-03-11 DIAGNOSIS — T80219A Unspecified infection due to central venous catheter, initial encounter: Secondary | ICD-10-CM | POA: Diagnosis not present

## 2021-03-11 DIAGNOSIS — C499 Malignant neoplasm of connective and soft tissue, unspecified: Secondary | ICD-10-CM | POA: Diagnosis not present

## 2021-04-01 DIAGNOSIS — C499 Malignant neoplasm of connective and soft tissue, unspecified: Secondary | ICD-10-CM | POA: Diagnosis not present

## 2021-04-06 DIAGNOSIS — C499 Malignant neoplasm of connective and soft tissue, unspecified: Secondary | ICD-10-CM | POA: Diagnosis not present

## 2021-04-06 DIAGNOSIS — C7802 Secondary malignant neoplasm of left lung: Secondary | ICD-10-CM | POA: Diagnosis not present

## 2021-04-06 DIAGNOSIS — T80219A Unspecified infection due to central venous catheter, initial encounter: Secondary | ICD-10-CM | POA: Diagnosis not present

## 2021-04-06 DIAGNOSIS — Y832 Surgical operation with anastomosis, bypass or graft as the cause of abnormal reaction of the patient, or of later complication, without mention of misadventure at the time of the procedure: Secondary | ICD-10-CM | POA: Diagnosis not present

## 2021-04-08 DIAGNOSIS — C7989 Secondary malignant neoplasm of other specified sites: Secondary | ICD-10-CM | POA: Diagnosis not present

## 2021-04-08 DIAGNOSIS — Z452 Encounter for adjustment and management of vascular access device: Secondary | ICD-10-CM | POA: Diagnosis not present

## 2021-04-08 DIAGNOSIS — C499 Malignant neoplasm of connective and soft tissue, unspecified: Secondary | ICD-10-CM | POA: Diagnosis not present

## 2021-04-08 DIAGNOSIS — Z95828 Presence of other vascular implants and grafts: Secondary | ICD-10-CM | POA: Diagnosis not present

## 2021-04-08 DIAGNOSIS — Z5111 Encounter for antineoplastic chemotherapy: Secondary | ICD-10-CM | POA: Diagnosis not present

## 2021-04-29 DIAGNOSIS — C7989 Secondary malignant neoplasm of other specified sites: Secondary | ICD-10-CM | POA: Diagnosis not present

## 2021-04-29 DIAGNOSIS — J949 Pleural condition, unspecified: Secondary | ICD-10-CM | POA: Diagnosis not present

## 2021-04-29 DIAGNOSIS — I82423 Acute embolism and thrombosis of iliac vein, bilateral: Secondary | ICD-10-CM | POA: Diagnosis not present

## 2021-04-29 DIAGNOSIS — I8290 Acute embolism and thrombosis of unspecified vein: Secondary | ICD-10-CM | POA: Diagnosis not present

## 2021-04-29 DIAGNOSIS — C499 Malignant neoplasm of connective and soft tissue, unspecified: Secondary | ICD-10-CM | POA: Diagnosis not present

## 2021-04-29 DIAGNOSIS — R918 Other nonspecific abnormal finding of lung field: Secondary | ICD-10-CM | POA: Diagnosis not present

## 2021-04-29 DIAGNOSIS — I82220 Acute embolism and thrombosis of inferior vena cava: Secondary | ICD-10-CM | POA: Diagnosis not present

## 2021-04-29 DIAGNOSIS — C61 Malignant neoplasm of prostate: Secondary | ICD-10-CM | POA: Diagnosis not present

## 2021-04-29 DIAGNOSIS — C7802 Secondary malignant neoplasm of left lung: Secondary | ICD-10-CM | POA: Diagnosis not present

## 2021-04-29 DIAGNOSIS — Z5111 Encounter for antineoplastic chemotherapy: Secondary | ICD-10-CM | POA: Diagnosis not present

## 2021-05-10 ENCOUNTER — Other Ambulatory Visit: Payer: Self-pay | Admitting: Physician Assistant

## 2021-05-11 DIAGNOSIS — U071 COVID-19: Secondary | ICD-10-CM | POA: Diagnosis not present

## 2021-05-11 DIAGNOSIS — J45909 Unspecified asthma, uncomplicated: Secondary | ICD-10-CM | POA: Diagnosis not present

## 2021-05-11 DIAGNOSIS — C7802 Secondary malignant neoplasm of left lung: Secondary | ICD-10-CM | POA: Diagnosis not present

## 2021-05-11 DIAGNOSIS — C7801 Secondary malignant neoplasm of right lung: Secondary | ICD-10-CM | POA: Diagnosis not present

## 2021-05-11 DIAGNOSIS — C61 Malignant neoplasm of prostate: Secondary | ICD-10-CM | POA: Diagnosis not present

## 2021-05-11 DIAGNOSIS — R519 Headache, unspecified: Secondary | ICD-10-CM | POA: Diagnosis not present

## 2021-05-11 DIAGNOSIS — R5081 Fever presenting with conditions classified elsewhere: Secondary | ICD-10-CM | POA: Diagnosis not present

## 2021-05-11 DIAGNOSIS — C499 Malignant neoplasm of connective and soft tissue, unspecified: Secondary | ICD-10-CM | POA: Diagnosis not present

## 2021-05-11 DIAGNOSIS — C7989 Secondary malignant neoplasm of other specified sites: Secondary | ICD-10-CM | POA: Diagnosis not present

## 2021-05-11 DIAGNOSIS — G8929 Other chronic pain: Secondary | ICD-10-CM | POA: Diagnosis not present

## 2021-05-11 DIAGNOSIS — R69 Illness, unspecified: Secondary | ICD-10-CM | POA: Diagnosis not present

## 2021-05-11 DIAGNOSIS — D709 Neutropenia, unspecified: Secondary | ICD-10-CM | POA: Diagnosis not present

## 2021-05-12 DIAGNOSIS — D709 Neutropenia, unspecified: Secondary | ICD-10-CM | POA: Insufficient documentation

## 2021-05-12 DIAGNOSIS — U071 COVID-19: Secondary | ICD-10-CM | POA: Insufficient documentation

## 2021-05-17 DIAGNOSIS — Z1379 Encounter for other screening for genetic and chromosomal anomalies: Secondary | ICD-10-CM | POA: Insufficient documentation

## 2021-05-21 DIAGNOSIS — C61 Malignant neoplasm of prostate: Secondary | ICD-10-CM | POA: Diagnosis not present

## 2021-05-21 DIAGNOSIS — C7802 Secondary malignant neoplasm of left lung: Secondary | ICD-10-CM | POA: Diagnosis not present

## 2021-05-31 DIAGNOSIS — K0263 Dental caries on smooth surface penetrating into pulp: Secondary | ICD-10-CM | POA: Diagnosis not present

## 2021-06-04 DIAGNOSIS — Z79899 Other long term (current) drug therapy: Secondary | ICD-10-CM | POA: Diagnosis not present

## 2021-06-04 DIAGNOSIS — C499 Malignant neoplasm of connective and soft tissue, unspecified: Secondary | ICD-10-CM | POA: Diagnosis not present

## 2021-06-04 DIAGNOSIS — C7802 Secondary malignant neoplasm of left lung: Secondary | ICD-10-CM | POA: Diagnosis not present

## 2021-06-04 DIAGNOSIS — C7989 Secondary malignant neoplasm of other specified sites: Secondary | ICD-10-CM | POA: Diagnosis not present

## 2021-06-04 DIAGNOSIS — D709 Neutropenia, unspecified: Secondary | ICD-10-CM | POA: Diagnosis not present

## 2021-06-04 DIAGNOSIS — Z5111 Encounter for antineoplastic chemotherapy: Secondary | ICD-10-CM | POA: Diagnosis not present

## 2021-06-04 DIAGNOSIS — C7801 Secondary malignant neoplasm of right lung: Secondary | ICD-10-CM | POA: Diagnosis not present

## 2021-06-23 DIAGNOSIS — Z79899 Other long term (current) drug therapy: Secondary | ICD-10-CM | POA: Diagnosis not present

## 2021-06-23 DIAGNOSIS — C7801 Secondary malignant neoplasm of right lung: Secondary | ICD-10-CM | POA: Diagnosis not present

## 2021-06-23 DIAGNOSIS — R600 Localized edema: Secondary | ICD-10-CM | POA: Diagnosis not present

## 2021-06-23 DIAGNOSIS — R011 Cardiac murmur, unspecified: Secondary | ICD-10-CM | POA: Diagnosis not present

## 2021-06-23 DIAGNOSIS — I8290 Acute embolism and thrombosis of unspecified vein: Secondary | ICD-10-CM | POA: Diagnosis not present

## 2021-06-23 DIAGNOSIS — C499 Malignant neoplasm of connective and soft tissue, unspecified: Secondary | ICD-10-CM | POA: Diagnosis not present

## 2021-06-23 DIAGNOSIS — C7802 Secondary malignant neoplasm of left lung: Secondary | ICD-10-CM | POA: Diagnosis not present

## 2021-06-23 DIAGNOSIS — R5383 Other fatigue: Secondary | ICD-10-CM | POA: Diagnosis not present

## 2021-06-23 DIAGNOSIS — Z5111 Encounter for antineoplastic chemotherapy: Secondary | ICD-10-CM | POA: Diagnosis not present

## 2021-06-23 DIAGNOSIS — C7989 Secondary malignant neoplasm of other specified sites: Secondary | ICD-10-CM | POA: Diagnosis not present

## 2021-07-19 DIAGNOSIS — M9901 Segmental and somatic dysfunction of cervical region: Secondary | ICD-10-CM | POA: Diagnosis not present

## 2021-07-19 DIAGNOSIS — M436 Torticollis: Secondary | ICD-10-CM | POA: Diagnosis not present

## 2021-07-19 DIAGNOSIS — M9903 Segmental and somatic dysfunction of lumbar region: Secondary | ICD-10-CM | POA: Diagnosis not present

## 2021-07-19 DIAGNOSIS — M9902 Segmental and somatic dysfunction of thoracic region: Secondary | ICD-10-CM | POA: Diagnosis not present

## 2021-07-19 DIAGNOSIS — M7912 Myalgia of auxiliary muscles, head and neck: Secondary | ICD-10-CM | POA: Diagnosis not present

## 2021-07-22 DIAGNOSIS — R918 Other nonspecific abnormal finding of lung field: Secondary | ICD-10-CM | POA: Diagnosis not present

## 2021-07-22 DIAGNOSIS — Z79899 Other long term (current) drug therapy: Secondary | ICD-10-CM | POA: Diagnosis not present

## 2021-07-22 DIAGNOSIS — C782 Secondary malignant neoplasm of pleura: Secondary | ICD-10-CM | POA: Diagnosis not present

## 2021-07-22 DIAGNOSIS — C499 Malignant neoplasm of connective and soft tissue, unspecified: Secondary | ICD-10-CM | POA: Diagnosis not present

## 2021-07-22 DIAGNOSIS — C7989 Secondary malignant neoplasm of other specified sites: Secondary | ICD-10-CM | POA: Diagnosis not present

## 2021-07-28 DIAGNOSIS — M7912 Myalgia of auxiliary muscles, head and neck: Secondary | ICD-10-CM | POA: Diagnosis not present

## 2021-07-28 DIAGNOSIS — M436 Torticollis: Secondary | ICD-10-CM | POA: Diagnosis not present

## 2021-07-28 DIAGNOSIS — M9901 Segmental and somatic dysfunction of cervical region: Secondary | ICD-10-CM | POA: Diagnosis not present

## 2021-07-28 DIAGNOSIS — M9902 Segmental and somatic dysfunction of thoracic region: Secondary | ICD-10-CM | POA: Diagnosis not present

## 2021-07-28 DIAGNOSIS — M9903 Segmental and somatic dysfunction of lumbar region: Secondary | ICD-10-CM | POA: Diagnosis not present

## 2021-08-04 DIAGNOSIS — M9902 Segmental and somatic dysfunction of thoracic region: Secondary | ICD-10-CM | POA: Diagnosis not present

## 2021-08-04 DIAGNOSIS — M436 Torticollis: Secondary | ICD-10-CM | POA: Diagnosis not present

## 2021-08-04 DIAGNOSIS — M7912 Myalgia of auxiliary muscles, head and neck: Secondary | ICD-10-CM | POA: Diagnosis not present

## 2021-08-04 DIAGNOSIS — M9903 Segmental and somatic dysfunction of lumbar region: Secondary | ICD-10-CM | POA: Diagnosis not present

## 2021-08-04 DIAGNOSIS — M9901 Segmental and somatic dysfunction of cervical region: Secondary | ICD-10-CM | POA: Diagnosis not present

## 2021-08-10 DIAGNOSIS — M9903 Segmental and somatic dysfunction of lumbar region: Secondary | ICD-10-CM | POA: Diagnosis not present

## 2021-08-10 DIAGNOSIS — M7912 Myalgia of auxiliary muscles, head and neck: Secondary | ICD-10-CM | POA: Diagnosis not present

## 2021-08-10 DIAGNOSIS — M436 Torticollis: Secondary | ICD-10-CM | POA: Diagnosis not present

## 2021-08-10 DIAGNOSIS — M9901 Segmental and somatic dysfunction of cervical region: Secondary | ICD-10-CM | POA: Diagnosis not present

## 2021-08-10 DIAGNOSIS — M9902 Segmental and somatic dysfunction of thoracic region: Secondary | ICD-10-CM | POA: Diagnosis not present

## 2021-09-10 ENCOUNTER — Other Ambulatory Visit: Payer: Self-pay

## 2021-09-10 ENCOUNTER — Ambulatory Visit: Payer: Self-pay

## 2021-09-10 DIAGNOSIS — Z23 Encounter for immunization: Secondary | ICD-10-CM

## 2021-09-10 NOTE — Progress Notes (Signed)
Flu vaccine administered.Burna Sis

## 2021-10-14 DIAGNOSIS — C499 Malignant neoplasm of connective and soft tissue, unspecified: Secondary | ICD-10-CM | POA: Diagnosis not present

## 2021-10-14 DIAGNOSIS — C7989 Secondary malignant neoplasm of other specified sites: Secondary | ICD-10-CM | POA: Diagnosis not present

## 2021-10-14 DIAGNOSIS — N4289 Other specified disorders of prostate: Secondary | ICD-10-CM | POA: Diagnosis not present

## 2021-10-14 DIAGNOSIS — C61 Malignant neoplasm of prostate: Secondary | ICD-10-CM | POA: Diagnosis not present

## 2021-10-14 DIAGNOSIS — J949 Pleural condition, unspecified: Secondary | ICD-10-CM | POA: Diagnosis not present

## 2021-10-14 DIAGNOSIS — Z79899 Other long term (current) drug therapy: Secondary | ICD-10-CM | POA: Diagnosis not present

## 2021-10-14 DIAGNOSIS — C782 Secondary malignant neoplasm of pleura: Secondary | ICD-10-CM | POA: Diagnosis not present

## 2021-10-14 DIAGNOSIS — I82220 Acute embolism and thrombosis of inferior vena cava: Secondary | ICD-10-CM | POA: Diagnosis not present

## 2021-11-25 DIAGNOSIS — C7989 Secondary malignant neoplasm of other specified sites: Secondary | ICD-10-CM | POA: Diagnosis not present

## 2021-11-25 DIAGNOSIS — R5383 Other fatigue: Secondary | ICD-10-CM | POA: Diagnosis not present

## 2021-11-25 DIAGNOSIS — I8290 Acute embolism and thrombosis of unspecified vein: Secondary | ICD-10-CM | POA: Diagnosis not present

## 2021-11-25 DIAGNOSIS — Z79899 Other long term (current) drug therapy: Secondary | ICD-10-CM | POA: Diagnosis not present

## 2021-11-25 DIAGNOSIS — C499 Malignant neoplasm of connective and soft tissue, unspecified: Secondary | ICD-10-CM | POA: Diagnosis not present

## 2022-01-27 DIAGNOSIS — N4289 Other specified disorders of prostate: Secondary | ICD-10-CM | POA: Diagnosis not present

## 2022-01-27 DIAGNOSIS — C782 Secondary malignant neoplasm of pleura: Secondary | ICD-10-CM | POA: Diagnosis not present

## 2022-01-27 DIAGNOSIS — R918 Other nonspecific abnormal finding of lung field: Secondary | ICD-10-CM | POA: Diagnosis not present

## 2022-01-27 DIAGNOSIS — Z79899 Other long term (current) drug therapy: Secondary | ICD-10-CM | POA: Diagnosis not present

## 2022-01-27 DIAGNOSIS — K760 Fatty (change of) liver, not elsewhere classified: Secondary | ICD-10-CM | POA: Diagnosis not present

## 2022-01-27 DIAGNOSIS — C7802 Secondary malignant neoplasm of left lung: Secondary | ICD-10-CM | POA: Diagnosis not present

## 2022-01-27 DIAGNOSIS — C7801 Secondary malignant neoplasm of right lung: Secondary | ICD-10-CM | POA: Diagnosis not present

## 2022-01-27 DIAGNOSIS — I8290 Acute embolism and thrombosis of unspecified vein: Secondary | ICD-10-CM | POA: Diagnosis not present

## 2022-01-27 DIAGNOSIS — C499 Malignant neoplasm of connective and soft tissue, unspecified: Secondary | ICD-10-CM | POA: Diagnosis not present

## 2022-01-27 DIAGNOSIS — Z9889 Other specified postprocedural states: Secondary | ICD-10-CM | POA: Diagnosis not present

## 2022-01-27 DIAGNOSIS — R197 Diarrhea, unspecified: Secondary | ICD-10-CM | POA: Diagnosis not present

## 2022-01-27 DIAGNOSIS — Z7901 Long term (current) use of anticoagulants: Secondary | ICD-10-CM | POA: Diagnosis not present

## 2022-01-27 DIAGNOSIS — C7989 Secondary malignant neoplasm of other specified sites: Secondary | ICD-10-CM | POA: Diagnosis not present

## 2022-02-01 DIAGNOSIS — C7802 Secondary malignant neoplasm of left lung: Secondary | ICD-10-CM | POA: Diagnosis not present

## 2022-02-01 DIAGNOSIS — C7989 Secondary malignant neoplasm of other specified sites: Secondary | ICD-10-CM | POA: Diagnosis not present

## 2022-02-04 DIAGNOSIS — C7989 Secondary malignant neoplasm of other specified sites: Secondary | ICD-10-CM | POA: Diagnosis not present

## 2022-02-10 DIAGNOSIS — C7802 Secondary malignant neoplasm of left lung: Secondary | ICD-10-CM | POA: Diagnosis not present

## 2022-02-10 DIAGNOSIS — C7989 Secondary malignant neoplasm of other specified sites: Secondary | ICD-10-CM | POA: Diagnosis not present

## 2022-02-17 DIAGNOSIS — C7989 Secondary malignant neoplasm of other specified sites: Secondary | ICD-10-CM | POA: Diagnosis not present

## 2022-02-18 DIAGNOSIS — C7989 Secondary malignant neoplasm of other specified sites: Secondary | ICD-10-CM | POA: Diagnosis not present

## 2022-02-21 DIAGNOSIS — C7989 Secondary malignant neoplasm of other specified sites: Secondary | ICD-10-CM | POA: Diagnosis not present

## 2022-02-22 DIAGNOSIS — C7989 Secondary malignant neoplasm of other specified sites: Secondary | ICD-10-CM | POA: Diagnosis not present

## 2022-02-23 DIAGNOSIS — C7989 Secondary malignant neoplasm of other specified sites: Secondary | ICD-10-CM | POA: Diagnosis not present

## 2022-02-24 DIAGNOSIS — C7989 Secondary malignant neoplasm of other specified sites: Secondary | ICD-10-CM | POA: Diagnosis not present

## 2022-02-25 DIAGNOSIS — C7989 Secondary malignant neoplasm of other specified sites: Secondary | ICD-10-CM | POA: Diagnosis not present

## 2022-02-28 DIAGNOSIS — C7989 Secondary malignant neoplasm of other specified sites: Secondary | ICD-10-CM | POA: Diagnosis not present

## 2022-03-01 DIAGNOSIS — C7989 Secondary malignant neoplasm of other specified sites: Secondary | ICD-10-CM | POA: Diagnosis not present

## 2022-03-02 DIAGNOSIS — C7989 Secondary malignant neoplasm of other specified sites: Secondary | ICD-10-CM | POA: Diagnosis not present

## 2022-03-18 DIAGNOSIS — C7802 Secondary malignant neoplasm of left lung: Secondary | ICD-10-CM | POA: Diagnosis not present

## 2022-03-24 DIAGNOSIS — C499 Malignant neoplasm of connective and soft tissue, unspecified: Secondary | ICD-10-CM | POA: Diagnosis not present

## 2022-04-14 DIAGNOSIS — C499 Malignant neoplasm of connective and soft tissue, unspecified: Secondary | ICD-10-CM | POA: Diagnosis not present

## 2022-04-14 DIAGNOSIS — R5383 Other fatigue: Secondary | ICD-10-CM | POA: Diagnosis not present

## 2022-04-14 DIAGNOSIS — I82421 Acute embolism and thrombosis of right iliac vein: Secondary | ICD-10-CM | POA: Diagnosis not present

## 2022-04-14 DIAGNOSIS — C7989 Secondary malignant neoplasm of other specified sites: Secondary | ICD-10-CM | POA: Diagnosis not present

## 2022-04-14 DIAGNOSIS — R7989 Other specified abnormal findings of blood chemistry: Secondary | ICD-10-CM | POA: Diagnosis not present

## 2022-04-14 DIAGNOSIS — Z79899 Other long term (current) drug therapy: Secondary | ICD-10-CM | POA: Diagnosis not present

## 2022-04-14 DIAGNOSIS — C782 Secondary malignant neoplasm of pleura: Secondary | ICD-10-CM | POA: Diagnosis not present

## 2022-04-14 DIAGNOSIS — R918 Other nonspecific abnormal finding of lung field: Secondary | ICD-10-CM | POA: Diagnosis not present

## 2022-04-21 DIAGNOSIS — Z79899 Other long term (current) drug therapy: Secondary | ICD-10-CM | POA: Diagnosis not present

## 2022-04-21 DIAGNOSIS — C499 Malignant neoplasm of connective and soft tissue, unspecified: Secondary | ICD-10-CM | POA: Diagnosis not present

## 2022-04-21 DIAGNOSIS — C7989 Secondary malignant neoplasm of other specified sites: Secondary | ICD-10-CM | POA: Diagnosis not present

## 2022-04-21 DIAGNOSIS — C61 Malignant neoplasm of prostate: Secondary | ICD-10-CM | POA: Diagnosis not present

## 2022-04-21 DIAGNOSIS — R7989 Other specified abnormal findings of blood chemistry: Secondary | ICD-10-CM | POA: Diagnosis not present

## 2022-04-21 DIAGNOSIS — R7401 Elevation of levels of liver transaminase levels: Secondary | ICD-10-CM | POA: Diagnosis not present

## 2022-04-21 DIAGNOSIS — C7802 Secondary malignant neoplasm of left lung: Secondary | ICD-10-CM | POA: Diagnosis not present

## 2022-04-29 DIAGNOSIS — R7989 Other specified abnormal findings of blood chemistry: Secondary | ICD-10-CM | POA: Diagnosis not present

## 2022-04-29 DIAGNOSIS — C499 Malignant neoplasm of connective and soft tissue, unspecified: Secondary | ICD-10-CM | POA: Diagnosis not present

## 2022-04-29 DIAGNOSIS — I82220 Acute embolism and thrombosis of inferior vena cava: Secondary | ICD-10-CM | POA: Diagnosis not present

## 2022-04-29 DIAGNOSIS — Z79899 Other long term (current) drug therapy: Secondary | ICD-10-CM | POA: Diagnosis not present

## 2022-05-04 DIAGNOSIS — C499 Malignant neoplasm of connective and soft tissue, unspecified: Secondary | ICD-10-CM | POA: Diagnosis not present

## 2022-05-04 DIAGNOSIS — C7989 Secondary malignant neoplasm of other specified sites: Secondary | ICD-10-CM | POA: Diagnosis not present

## 2022-05-04 DIAGNOSIS — Z79899 Other long term (current) drug therapy: Secondary | ICD-10-CM | POA: Diagnosis not present

## 2022-05-18 DIAGNOSIS — C782 Secondary malignant neoplasm of pleura: Secondary | ICD-10-CM | POA: Diagnosis not present

## 2022-05-18 DIAGNOSIS — C7802 Secondary malignant neoplasm of left lung: Secondary | ICD-10-CM | POA: Diagnosis not present

## 2022-05-18 DIAGNOSIS — Z79899 Other long term (current) drug therapy: Secondary | ICD-10-CM | POA: Diagnosis not present

## 2022-05-18 DIAGNOSIS — R7989 Other specified abnormal findings of blood chemistry: Secondary | ICD-10-CM | POA: Diagnosis not present

## 2022-05-18 DIAGNOSIS — C499 Malignant neoplasm of connective and soft tissue, unspecified: Secondary | ICD-10-CM | POA: Diagnosis not present

## 2022-05-18 DIAGNOSIS — Z7901 Long term (current) use of anticoagulants: Secondary | ICD-10-CM | POA: Diagnosis not present

## 2022-05-30 DIAGNOSIS — R7989 Other specified abnormal findings of blood chemistry: Secondary | ICD-10-CM | POA: Diagnosis not present

## 2022-06-15 DIAGNOSIS — Z79899 Other long term (current) drug therapy: Secondary | ICD-10-CM | POA: Diagnosis not present

## 2022-06-15 DIAGNOSIS — C499 Malignant neoplasm of connective and soft tissue, unspecified: Secondary | ICD-10-CM | POA: Diagnosis not present

## 2022-06-15 DIAGNOSIS — C782 Secondary malignant neoplasm of pleura: Secondary | ICD-10-CM | POA: Diagnosis not present

## 2022-06-15 DIAGNOSIS — N429 Disorder of prostate, unspecified: Secondary | ICD-10-CM | POA: Diagnosis not present

## 2022-06-15 DIAGNOSIS — C7989 Secondary malignant neoplasm of other specified sites: Secondary | ICD-10-CM | POA: Diagnosis not present

## 2022-06-15 DIAGNOSIS — R7401 Elevation of levels of liver transaminase levels: Secondary | ICD-10-CM | POA: Diagnosis not present

## 2022-06-15 DIAGNOSIS — I7409 Other arterial embolism and thrombosis of abdominal aorta: Secondary | ICD-10-CM | POA: Diagnosis not present

## 2022-06-15 DIAGNOSIS — C7802 Secondary malignant neoplasm of left lung: Secondary | ICD-10-CM | POA: Diagnosis not present

## 2022-06-15 DIAGNOSIS — C493 Malignant neoplasm of connective and soft tissue of thorax: Secondary | ICD-10-CM | POA: Diagnosis not present

## 2022-06-21 DIAGNOSIS — C7801 Secondary malignant neoplasm of right lung: Secondary | ICD-10-CM | POA: Diagnosis not present

## 2022-06-21 DIAGNOSIS — Z79899 Other long term (current) drug therapy: Secondary | ICD-10-CM | POA: Diagnosis not present

## 2022-06-21 DIAGNOSIS — C7802 Secondary malignant neoplasm of left lung: Secondary | ICD-10-CM | POA: Diagnosis not present

## 2022-06-21 DIAGNOSIS — C493 Malignant neoplasm of connective and soft tissue of thorax: Secondary | ICD-10-CM | POA: Diagnosis not present

## 2022-06-21 DIAGNOSIS — Z7901 Long term (current) use of anticoagulants: Secondary | ICD-10-CM | POA: Diagnosis not present

## 2022-06-21 DIAGNOSIS — C782 Secondary malignant neoplasm of pleura: Secondary | ICD-10-CM | POA: Diagnosis not present

## 2022-07-01 DIAGNOSIS — Z79899 Other long term (current) drug therapy: Secondary | ICD-10-CM | POA: Diagnosis not present

## 2022-07-01 DIAGNOSIS — Z0189 Encounter for other specified special examinations: Secondary | ICD-10-CM | POA: Diagnosis not present

## 2022-07-22 DIAGNOSIS — Z0189 Encounter for other specified special examinations: Secondary | ICD-10-CM | POA: Diagnosis not present

## 2022-07-22 DIAGNOSIS — Z79899 Other long term (current) drug therapy: Secondary | ICD-10-CM | POA: Diagnosis not present

## 2022-08-11 DIAGNOSIS — M7912 Myalgia of auxiliary muscles, head and neck: Secondary | ICD-10-CM | POA: Diagnosis not present

## 2022-08-11 DIAGNOSIS — M9901 Segmental and somatic dysfunction of cervical region: Secondary | ICD-10-CM | POA: Diagnosis not present

## 2022-08-11 DIAGNOSIS — M436 Torticollis: Secondary | ICD-10-CM | POA: Diagnosis not present

## 2022-08-11 DIAGNOSIS — M9902 Segmental and somatic dysfunction of thoracic region: Secondary | ICD-10-CM | POA: Diagnosis not present

## 2022-08-11 DIAGNOSIS — M9903 Segmental and somatic dysfunction of lumbar region: Secondary | ICD-10-CM | POA: Diagnosis not present

## 2022-08-18 DIAGNOSIS — Z79899 Other long term (current) drug therapy: Secondary | ICD-10-CM | POA: Diagnosis not present

## 2022-08-18 DIAGNOSIS — C7801 Secondary malignant neoplasm of right lung: Secondary | ICD-10-CM | POA: Diagnosis not present

## 2022-08-18 DIAGNOSIS — C7802 Secondary malignant neoplasm of left lung: Secondary | ICD-10-CM | POA: Diagnosis not present

## 2022-08-18 DIAGNOSIS — N429 Disorder of prostate, unspecified: Secondary | ICD-10-CM | POA: Diagnosis not present

## 2022-08-18 DIAGNOSIS — C61 Malignant neoplasm of prostate: Secondary | ICD-10-CM | POA: Diagnosis not present

## 2022-08-18 DIAGNOSIS — C7989 Secondary malignant neoplasm of other specified sites: Secondary | ICD-10-CM | POA: Diagnosis not present

## 2022-08-18 DIAGNOSIS — C782 Secondary malignant neoplasm of pleura: Secondary | ICD-10-CM | POA: Diagnosis not present

## 2022-08-18 DIAGNOSIS — I82220 Acute embolism and thrombosis of inferior vena cava: Secondary | ICD-10-CM | POA: Diagnosis not present

## 2022-08-18 DIAGNOSIS — C499 Malignant neoplasm of connective and soft tissue, unspecified: Secondary | ICD-10-CM | POA: Diagnosis not present

## 2022-08-26 DIAGNOSIS — M546 Pain in thoracic spine: Secondary | ICD-10-CM | POA: Diagnosis not present

## 2022-08-26 DIAGNOSIS — M62838 Other muscle spasm: Secondary | ICD-10-CM | POA: Diagnosis not present

## 2022-08-26 DIAGNOSIS — M542 Cervicalgia: Secondary | ICD-10-CM | POA: Diagnosis not present

## 2022-08-29 DIAGNOSIS — M542 Cervicalgia: Secondary | ICD-10-CM | POA: Diagnosis not present

## 2022-08-29 DIAGNOSIS — M546 Pain in thoracic spine: Secondary | ICD-10-CM | POA: Diagnosis not present

## 2022-08-29 DIAGNOSIS — M62838 Other muscle spasm: Secondary | ICD-10-CM | POA: Diagnosis not present

## 2022-09-01 DIAGNOSIS — M542 Cervicalgia: Secondary | ICD-10-CM | POA: Diagnosis not present

## 2022-09-01 DIAGNOSIS — M546 Pain in thoracic spine: Secondary | ICD-10-CM | POA: Diagnosis not present

## 2022-09-01 DIAGNOSIS — M62838 Other muscle spasm: Secondary | ICD-10-CM | POA: Diagnosis not present

## 2022-09-05 DIAGNOSIS — M546 Pain in thoracic spine: Secondary | ICD-10-CM | POA: Diagnosis not present

## 2022-09-05 DIAGNOSIS — M62838 Other muscle spasm: Secondary | ICD-10-CM | POA: Diagnosis not present

## 2022-09-05 DIAGNOSIS — M542 Cervicalgia: Secondary | ICD-10-CM | POA: Diagnosis not present

## 2022-09-12 DIAGNOSIS — M542 Cervicalgia: Secondary | ICD-10-CM | POA: Diagnosis not present

## 2022-09-12 DIAGNOSIS — M546 Pain in thoracic spine: Secondary | ICD-10-CM | POA: Diagnosis not present

## 2022-09-12 DIAGNOSIS — M62838 Other muscle spasm: Secondary | ICD-10-CM | POA: Diagnosis not present

## 2022-09-22 DIAGNOSIS — M546 Pain in thoracic spine: Secondary | ICD-10-CM | POA: Diagnosis not present

## 2022-09-22 DIAGNOSIS — M62838 Other muscle spasm: Secondary | ICD-10-CM | POA: Diagnosis not present

## 2022-09-22 DIAGNOSIS — M542 Cervicalgia: Secondary | ICD-10-CM | POA: Diagnosis not present

## 2022-09-26 DIAGNOSIS — M9901 Segmental and somatic dysfunction of cervical region: Secondary | ICD-10-CM | POA: Diagnosis not present

## 2022-09-26 DIAGNOSIS — M9902 Segmental and somatic dysfunction of thoracic region: Secondary | ICD-10-CM | POA: Diagnosis not present

## 2022-09-26 DIAGNOSIS — M9903 Segmental and somatic dysfunction of lumbar region: Secondary | ICD-10-CM | POA: Diagnosis not present

## 2022-09-26 DIAGNOSIS — M436 Torticollis: Secondary | ICD-10-CM | POA: Diagnosis not present

## 2022-09-26 DIAGNOSIS — M7912 Myalgia of auxiliary muscles, head and neck: Secondary | ICD-10-CM | POA: Diagnosis not present

## 2022-09-27 DIAGNOSIS — M542 Cervicalgia: Secondary | ICD-10-CM | POA: Diagnosis not present

## 2022-09-27 DIAGNOSIS — M62838 Other muscle spasm: Secondary | ICD-10-CM | POA: Diagnosis not present

## 2022-09-27 DIAGNOSIS — M546 Pain in thoracic spine: Secondary | ICD-10-CM | POA: Diagnosis not present

## 2022-10-03 DIAGNOSIS — M62838 Other muscle spasm: Secondary | ICD-10-CM | POA: Diagnosis not present

## 2022-10-03 DIAGNOSIS — M542 Cervicalgia: Secondary | ICD-10-CM | POA: Diagnosis not present

## 2022-10-03 DIAGNOSIS — M546 Pain in thoracic spine: Secondary | ICD-10-CM | POA: Diagnosis not present

## 2022-10-06 DIAGNOSIS — M542 Cervicalgia: Secondary | ICD-10-CM | POA: Diagnosis not present

## 2022-10-06 DIAGNOSIS — M546 Pain in thoracic spine: Secondary | ICD-10-CM | POA: Diagnosis not present

## 2022-10-06 DIAGNOSIS — M62838 Other muscle spasm: Secondary | ICD-10-CM | POA: Diagnosis not present

## 2022-10-10 DIAGNOSIS — M7912 Myalgia of auxiliary muscles, head and neck: Secondary | ICD-10-CM | POA: Diagnosis not present

## 2022-10-10 DIAGNOSIS — M436 Torticollis: Secondary | ICD-10-CM | POA: Diagnosis not present

## 2022-10-10 DIAGNOSIS — M9901 Segmental and somatic dysfunction of cervical region: Secondary | ICD-10-CM | POA: Diagnosis not present

## 2022-10-10 DIAGNOSIS — M9902 Segmental and somatic dysfunction of thoracic region: Secondary | ICD-10-CM | POA: Diagnosis not present

## 2022-10-10 DIAGNOSIS — M9903 Segmental and somatic dysfunction of lumbar region: Secondary | ICD-10-CM | POA: Diagnosis not present

## 2022-10-12 DIAGNOSIS — M542 Cervicalgia: Secondary | ICD-10-CM | POA: Diagnosis not present

## 2022-10-12 DIAGNOSIS — M62838 Other muscle spasm: Secondary | ICD-10-CM | POA: Diagnosis not present

## 2022-10-12 DIAGNOSIS — M546 Pain in thoracic spine: Secondary | ICD-10-CM | POA: Diagnosis not present

## 2022-10-13 DIAGNOSIS — M62838 Other muscle spasm: Secondary | ICD-10-CM | POA: Diagnosis not present

## 2022-10-13 DIAGNOSIS — M542 Cervicalgia: Secondary | ICD-10-CM | POA: Diagnosis not present

## 2022-10-13 DIAGNOSIS — M546 Pain in thoracic spine: Secondary | ICD-10-CM | POA: Diagnosis not present

## 2022-10-18 DIAGNOSIS — Z6827 Body mass index (BMI) 27.0-27.9, adult: Secondary | ICD-10-CM | POA: Diagnosis not present

## 2022-10-18 DIAGNOSIS — J029 Acute pharyngitis, unspecified: Secondary | ICD-10-CM | POA: Diagnosis not present

## 2022-10-18 DIAGNOSIS — R051 Acute cough: Secondary | ICD-10-CM | POA: Diagnosis not present

## 2022-10-18 DIAGNOSIS — J0140 Acute pansinusitis, unspecified: Secondary | ICD-10-CM | POA: Diagnosis not present

## 2022-11-01 DIAGNOSIS — M9903 Segmental and somatic dysfunction of lumbar region: Secondary | ICD-10-CM | POA: Diagnosis not present

## 2022-11-01 DIAGNOSIS — M9901 Segmental and somatic dysfunction of cervical region: Secondary | ICD-10-CM | POA: Diagnosis not present

## 2022-11-01 DIAGNOSIS — M436 Torticollis: Secondary | ICD-10-CM | POA: Diagnosis not present

## 2022-11-01 DIAGNOSIS — M7912 Myalgia of auxiliary muscles, head and neck: Secondary | ICD-10-CM | POA: Diagnosis not present

## 2022-11-01 DIAGNOSIS — M9902 Segmental and somatic dysfunction of thoracic region: Secondary | ICD-10-CM | POA: Diagnosis not present

## 2022-11-09 DIAGNOSIS — C782 Secondary malignant neoplasm of pleura: Secondary | ICD-10-CM | POA: Diagnosis not present

## 2022-11-09 DIAGNOSIS — R69 Illness, unspecified: Secondary | ICD-10-CM | POA: Diagnosis not present

## 2022-11-09 DIAGNOSIS — C61 Malignant neoplasm of prostate: Secondary | ICD-10-CM | POA: Diagnosis not present

## 2022-11-09 DIAGNOSIS — C495 Malignant neoplasm of connective and soft tissue of pelvis: Secondary | ICD-10-CM | POA: Diagnosis not present

## 2022-11-09 DIAGNOSIS — C7989 Secondary malignant neoplasm of other specified sites: Secondary | ICD-10-CM | POA: Diagnosis not present

## 2022-11-09 DIAGNOSIS — R918 Other nonspecific abnormal finding of lung field: Secondary | ICD-10-CM | POA: Diagnosis not present

## 2022-11-09 DIAGNOSIS — C499 Malignant neoplasm of connective and soft tissue, unspecified: Secondary | ICD-10-CM | POA: Diagnosis not present

## 2022-11-09 DIAGNOSIS — Z79899 Other long term (current) drug therapy: Secondary | ICD-10-CM | POA: Diagnosis not present

## 2022-11-09 DIAGNOSIS — C7802 Secondary malignant neoplasm of left lung: Secondary | ICD-10-CM | POA: Diagnosis not present

## 2022-11-18 DIAGNOSIS — A499 Bacterial infection, unspecified: Secondary | ICD-10-CM | POA: Diagnosis not present

## 2022-12-21 DIAGNOSIS — J22 Unspecified acute lower respiratory infection: Secondary | ICD-10-CM | POA: Diagnosis not present

## 2022-12-21 DIAGNOSIS — R509 Fever, unspecified: Secondary | ICD-10-CM | POA: Diagnosis not present

## 2022-12-21 DIAGNOSIS — Z1152 Encounter for screening for COVID-19: Secondary | ICD-10-CM | POA: Diagnosis not present

## 2022-12-21 DIAGNOSIS — C7989 Secondary malignant neoplasm of other specified sites: Secondary | ICD-10-CM | POA: Diagnosis not present

## 2022-12-21 DIAGNOSIS — R059 Cough, unspecified: Secondary | ICD-10-CM | POA: Diagnosis not present

## 2022-12-25 DIAGNOSIS — R6889 Other general symptoms and signs: Secondary | ICD-10-CM | POA: Insufficient documentation

## 2022-12-25 DIAGNOSIS — J22 Unspecified acute lower respiratory infection: Secondary | ICD-10-CM | POA: Insufficient documentation

## 2023-01-18 DIAGNOSIS — H5213 Myopia, bilateral: Secondary | ICD-10-CM | POA: Diagnosis not present

## 2023-01-19 DIAGNOSIS — I82421 Acute embolism and thrombosis of right iliac vein: Secondary | ICD-10-CM | POA: Diagnosis not present

## 2023-01-19 DIAGNOSIS — C495 Malignant neoplasm of connective and soft tissue of pelvis: Secondary | ICD-10-CM | POA: Diagnosis not present

## 2023-01-19 DIAGNOSIS — N429 Disorder of prostate, unspecified: Secondary | ICD-10-CM | POA: Diagnosis not present

## 2023-01-19 DIAGNOSIS — Z79899 Other long term (current) drug therapy: Secondary | ICD-10-CM | POA: Diagnosis not present

## 2023-01-19 DIAGNOSIS — I82220 Acute embolism and thrombosis of inferior vena cava: Secondary | ICD-10-CM | POA: Diagnosis not present

## 2023-01-19 DIAGNOSIS — C782 Secondary malignant neoplasm of pleura: Secondary | ICD-10-CM | POA: Diagnosis not present

## 2023-01-19 DIAGNOSIS — C499 Malignant neoplasm of connective and soft tissue, unspecified: Secondary | ICD-10-CM | POA: Diagnosis not present

## 2023-01-19 DIAGNOSIS — C7989 Secondary malignant neoplasm of other specified sites: Secondary | ICD-10-CM | POA: Diagnosis not present

## 2023-01-25 DIAGNOSIS — Z5181 Encounter for therapeutic drug level monitoring: Secondary | ICD-10-CM | POA: Diagnosis not present

## 2023-01-25 DIAGNOSIS — Z79899 Other long term (current) drug therapy: Secondary | ICD-10-CM | POA: Diagnosis not present

## 2023-02-03 DIAGNOSIS — Z5181 Encounter for therapeutic drug level monitoring: Secondary | ICD-10-CM | POA: Diagnosis not present

## 2023-02-03 DIAGNOSIS — Z79899 Other long term (current) drug therapy: Secondary | ICD-10-CM | POA: Diagnosis not present

## 2023-02-15 DIAGNOSIS — Z79899 Other long term (current) drug therapy: Secondary | ICD-10-CM | POA: Diagnosis not present

## 2023-02-15 DIAGNOSIS — Z5181 Encounter for therapeutic drug level monitoring: Secondary | ICD-10-CM | POA: Diagnosis not present

## 2023-02-22 DIAGNOSIS — Z79899 Other long term (current) drug therapy: Secondary | ICD-10-CM | POA: Diagnosis not present

## 2023-02-22 DIAGNOSIS — Z5181 Encounter for therapeutic drug level monitoring: Secondary | ICD-10-CM | POA: Diagnosis not present

## 2023-03-01 DIAGNOSIS — R799 Abnormal finding of blood chemistry, unspecified: Secondary | ICD-10-CM | POA: Diagnosis not present

## 2023-03-01 DIAGNOSIS — Z79899 Other long term (current) drug therapy: Secondary | ICD-10-CM | POA: Diagnosis not present

## 2023-03-08 DIAGNOSIS — Z79899 Other long term (current) drug therapy: Secondary | ICD-10-CM | POA: Diagnosis not present

## 2023-03-08 DIAGNOSIS — R799 Abnormal finding of blood chemistry, unspecified: Secondary | ICD-10-CM | POA: Diagnosis not present

## 2023-03-16 DIAGNOSIS — H209 Unspecified iridocyclitis: Secondary | ICD-10-CM | POA: Diagnosis not present

## 2023-04-26 DIAGNOSIS — C7802 Secondary malignant neoplasm of left lung: Secondary | ICD-10-CM | POA: Diagnosis not present

## 2023-04-26 DIAGNOSIS — I82220 Acute embolism and thrombosis of inferior vena cava: Secondary | ICD-10-CM | POA: Diagnosis not present

## 2023-04-26 DIAGNOSIS — J948 Other specified pleural conditions: Secondary | ICD-10-CM | POA: Diagnosis not present

## 2023-04-26 DIAGNOSIS — C499 Malignant neoplasm of connective and soft tissue, unspecified: Secondary | ICD-10-CM | POA: Diagnosis not present

## 2023-04-26 DIAGNOSIS — C495 Malignant neoplasm of connective and soft tissue of pelvis: Secondary | ICD-10-CM | POA: Diagnosis not present

## 2023-04-26 DIAGNOSIS — N429 Disorder of prostate, unspecified: Secondary | ICD-10-CM | POA: Diagnosis not present

## 2023-04-26 DIAGNOSIS — Z79899 Other long term (current) drug therapy: Secondary | ICD-10-CM | POA: Diagnosis not present

## 2023-04-26 DIAGNOSIS — R911 Solitary pulmonary nodule: Secondary | ICD-10-CM | POA: Diagnosis not present

## 2023-04-26 DIAGNOSIS — I82421 Acute embolism and thrombosis of right iliac vein: Secondary | ICD-10-CM | POA: Diagnosis not present

## 2023-04-26 DIAGNOSIS — C7989 Secondary malignant neoplasm of other specified sites: Secondary | ICD-10-CM | POA: Diagnosis not present

## 2023-05-10 DIAGNOSIS — Z79899 Other long term (current) drug therapy: Secondary | ICD-10-CM | POA: Diagnosis not present

## 2023-05-10 DIAGNOSIS — Z5181 Encounter for therapeutic drug level monitoring: Secondary | ICD-10-CM | POA: Diagnosis not present

## 2023-07-26 DIAGNOSIS — J9811 Atelectasis: Secondary | ICD-10-CM | POA: Diagnosis not present

## 2023-07-26 DIAGNOSIS — C7989 Secondary malignant neoplasm of other specified sites: Secondary | ICD-10-CM | POA: Diagnosis not present

## 2023-07-26 DIAGNOSIS — Z79899 Other long term (current) drug therapy: Secondary | ICD-10-CM | POA: Diagnosis not present

## 2023-07-26 DIAGNOSIS — C7800 Secondary malignant neoplasm of unspecified lung: Secondary | ICD-10-CM | POA: Diagnosis not present

## 2023-07-26 DIAGNOSIS — C61 Malignant neoplasm of prostate: Secondary | ICD-10-CM | POA: Diagnosis not present

## 2023-07-26 DIAGNOSIS — C499 Malignant neoplasm of connective and soft tissue, unspecified: Secondary | ICD-10-CM | POA: Diagnosis not present

## 2023-07-26 DIAGNOSIS — C495 Malignant neoplasm of connective and soft tissue of pelvis: Secondary | ICD-10-CM | POA: Diagnosis not present

## 2023-07-26 DIAGNOSIS — I82421 Acute embolism and thrombosis of right iliac vein: Secondary | ICD-10-CM | POA: Diagnosis not present

## 2023-07-26 DIAGNOSIS — N429 Disorder of prostate, unspecified: Secondary | ICD-10-CM | POA: Diagnosis not present

## 2023-07-26 DIAGNOSIS — C7802 Secondary malignant neoplasm of left lung: Secondary | ICD-10-CM | POA: Diagnosis not present

## 2023-08-24 DIAGNOSIS — R413 Other amnesia: Secondary | ICD-10-CM | POA: Diagnosis not present

## 2023-08-24 DIAGNOSIS — R4789 Other speech disturbances: Secondary | ICD-10-CM | POA: Diagnosis not present

## 2023-08-24 DIAGNOSIS — R4701 Aphasia: Secondary | ICD-10-CM | POA: Diagnosis not present

## 2023-08-24 DIAGNOSIS — C7989 Secondary malignant neoplasm of other specified sites: Secondary | ICD-10-CM | POA: Diagnosis not present

## 2023-08-24 DIAGNOSIS — C499 Malignant neoplasm of connective and soft tissue, unspecified: Secondary | ICD-10-CM | POA: Diagnosis not present

## 2023-08-30 DIAGNOSIS — C7989 Secondary malignant neoplasm of other specified sites: Secondary | ICD-10-CM | POA: Diagnosis not present

## 2023-08-30 DIAGNOSIS — I82421 Acute embolism and thrombosis of right iliac vein: Secondary | ICD-10-CM | POA: Diagnosis not present

## 2023-08-30 DIAGNOSIS — C499 Malignant neoplasm of connective and soft tissue, unspecified: Secondary | ICD-10-CM | POA: Diagnosis not present

## 2023-08-31 DIAGNOSIS — C7800 Secondary malignant neoplasm of unspecified lung: Secondary | ICD-10-CM | POA: Diagnosis not present

## 2023-08-31 DIAGNOSIS — C61 Malignant neoplasm of prostate: Secondary | ICD-10-CM | POA: Diagnosis not present

## 2023-08-31 DIAGNOSIS — C7989 Secondary malignant neoplasm of other specified sites: Secondary | ICD-10-CM | POA: Diagnosis not present

## 2023-09-07 DIAGNOSIS — C499 Malignant neoplasm of connective and soft tissue, unspecified: Secondary | ICD-10-CM | POA: Diagnosis not present

## 2023-09-07 DIAGNOSIS — C61 Malignant neoplasm of prostate: Secondary | ICD-10-CM | POA: Diagnosis not present

## 2023-09-07 DIAGNOSIS — C7802 Secondary malignant neoplasm of left lung: Secondary | ICD-10-CM | POA: Diagnosis not present

## 2023-09-07 DIAGNOSIS — M792 Neuralgia and neuritis, unspecified: Secondary | ICD-10-CM | POA: Diagnosis not present

## 2023-09-13 DIAGNOSIS — C7800 Secondary malignant neoplasm of unspecified lung: Secondary | ICD-10-CM | POA: Diagnosis not present

## 2023-09-13 DIAGNOSIS — C61 Malignant neoplasm of prostate: Secondary | ICD-10-CM | POA: Diagnosis not present

## 2023-09-14 DIAGNOSIS — C7989 Secondary malignant neoplasm of other specified sites: Secondary | ICD-10-CM | POA: Diagnosis not present

## 2023-09-14 DIAGNOSIS — C61 Malignant neoplasm of prostate: Secondary | ICD-10-CM | POA: Diagnosis not present

## 2023-09-14 DIAGNOSIS — C7800 Secondary malignant neoplasm of unspecified lung: Secondary | ICD-10-CM | POA: Diagnosis not present

## 2023-09-15 DIAGNOSIS — C61 Malignant neoplasm of prostate: Secondary | ICD-10-CM | POA: Diagnosis not present

## 2023-09-18 DIAGNOSIS — C61 Malignant neoplasm of prostate: Secondary | ICD-10-CM | POA: Diagnosis not present

## 2023-09-19 DIAGNOSIS — C61 Malignant neoplasm of prostate: Secondary | ICD-10-CM | POA: Diagnosis not present

## 2023-09-19 DIAGNOSIS — C7800 Secondary malignant neoplasm of unspecified lung: Secondary | ICD-10-CM | POA: Diagnosis not present

## 2023-09-19 DIAGNOSIS — C7989 Secondary malignant neoplasm of other specified sites: Secondary | ICD-10-CM | POA: Diagnosis not present

## 2023-09-20 DIAGNOSIS — C7800 Secondary malignant neoplasm of unspecified lung: Secondary | ICD-10-CM | POA: Diagnosis not present

## 2023-09-20 DIAGNOSIS — C61 Malignant neoplasm of prostate: Secondary | ICD-10-CM | POA: Diagnosis not present

## 2023-09-21 DIAGNOSIS — C7800 Secondary malignant neoplasm of unspecified lung: Secondary | ICD-10-CM | POA: Diagnosis not present

## 2023-09-21 DIAGNOSIS — C61 Malignant neoplasm of prostate: Secondary | ICD-10-CM | POA: Diagnosis not present

## 2023-09-22 DIAGNOSIS — C7800 Secondary malignant neoplasm of unspecified lung: Secondary | ICD-10-CM | POA: Diagnosis not present

## 2023-09-22 DIAGNOSIS — C61 Malignant neoplasm of prostate: Secondary | ICD-10-CM | POA: Diagnosis not present

## 2023-09-25 DIAGNOSIS — C61 Malignant neoplasm of prostate: Secondary | ICD-10-CM | POA: Diagnosis not present

## 2023-09-25 DIAGNOSIS — C7800 Secondary malignant neoplasm of unspecified lung: Secondary | ICD-10-CM | POA: Diagnosis not present

## 2023-09-26 DIAGNOSIS — C7800 Secondary malignant neoplasm of unspecified lung: Secondary | ICD-10-CM | POA: Diagnosis not present

## 2023-09-26 DIAGNOSIS — C61 Malignant neoplasm of prostate: Secondary | ICD-10-CM | POA: Diagnosis not present

## 2023-09-27 DIAGNOSIS — C7800 Secondary malignant neoplasm of unspecified lung: Secondary | ICD-10-CM | POA: Diagnosis not present

## 2023-09-27 DIAGNOSIS — C61 Malignant neoplasm of prostate: Secondary | ICD-10-CM | POA: Diagnosis not present

## 2023-09-28 DIAGNOSIS — C7800 Secondary malignant neoplasm of unspecified lung: Secondary | ICD-10-CM | POA: Diagnosis not present

## 2023-09-28 DIAGNOSIS — C61 Malignant neoplasm of prostate: Secondary | ICD-10-CM | POA: Diagnosis not present

## 2023-09-29 DIAGNOSIS — C7800 Secondary malignant neoplasm of unspecified lung: Secondary | ICD-10-CM | POA: Diagnosis not present

## 2023-09-29 DIAGNOSIS — C61 Malignant neoplasm of prostate: Secondary | ICD-10-CM | POA: Diagnosis not present

## 2023-10-02 DIAGNOSIS — C7800 Secondary malignant neoplasm of unspecified lung: Secondary | ICD-10-CM | POA: Diagnosis not present

## 2023-10-02 DIAGNOSIS — C61 Malignant neoplasm of prostate: Secondary | ICD-10-CM | POA: Diagnosis not present

## 2023-10-03 DIAGNOSIS — C7800 Secondary malignant neoplasm of unspecified lung: Secondary | ICD-10-CM | POA: Diagnosis not present

## 2023-10-03 DIAGNOSIS — C61 Malignant neoplasm of prostate: Secondary | ICD-10-CM | POA: Diagnosis not present

## 2023-10-04 DIAGNOSIS — C61 Malignant neoplasm of prostate: Secondary | ICD-10-CM | POA: Diagnosis not present

## 2023-10-04 DIAGNOSIS — C7800 Secondary malignant neoplasm of unspecified lung: Secondary | ICD-10-CM | POA: Diagnosis not present

## 2023-10-05 DIAGNOSIS — C7800 Secondary malignant neoplasm of unspecified lung: Secondary | ICD-10-CM | POA: Diagnosis not present

## 2023-10-05 DIAGNOSIS — C61 Malignant neoplasm of prostate: Secondary | ICD-10-CM | POA: Diagnosis not present

## 2023-10-06 DIAGNOSIS — C7800 Secondary malignant neoplasm of unspecified lung: Secondary | ICD-10-CM | POA: Diagnosis not present

## 2023-10-06 DIAGNOSIS — C61 Malignant neoplasm of prostate: Secondary | ICD-10-CM | POA: Diagnosis not present

## 2023-10-09 DIAGNOSIS — C61 Malignant neoplasm of prostate: Secondary | ICD-10-CM | POA: Diagnosis not present

## 2023-10-09 DIAGNOSIS — C7800 Secondary malignant neoplasm of unspecified lung: Secondary | ICD-10-CM | POA: Diagnosis not present

## 2023-10-10 DIAGNOSIS — C7800 Secondary malignant neoplasm of unspecified lung: Secondary | ICD-10-CM | POA: Diagnosis not present

## 2023-10-10 DIAGNOSIS — C61 Malignant neoplasm of prostate: Secondary | ICD-10-CM | POA: Diagnosis not present

## 2023-10-11 DIAGNOSIS — Z79899 Other long term (current) drug therapy: Secondary | ICD-10-CM | POA: Diagnosis not present

## 2023-10-11 DIAGNOSIS — C7989 Secondary malignant neoplasm of other specified sites: Secondary | ICD-10-CM | POA: Diagnosis not present

## 2023-10-11 DIAGNOSIS — C499 Malignant neoplasm of connective and soft tissue, unspecified: Secondary | ICD-10-CM | POA: Diagnosis not present

## 2023-10-11 DIAGNOSIS — C61 Malignant neoplasm of prostate: Secondary | ICD-10-CM | POA: Diagnosis not present

## 2023-10-11 DIAGNOSIS — C7802 Secondary malignant neoplasm of left lung: Secondary | ICD-10-CM | POA: Diagnosis not present

## 2023-10-31 DIAGNOSIS — J984 Other disorders of lung: Secondary | ICD-10-CM | POA: Diagnosis not present

## 2023-10-31 DIAGNOSIS — C61 Malignant neoplasm of prostate: Secondary | ICD-10-CM | POA: Diagnosis not present

## 2023-10-31 DIAGNOSIS — Z79899 Other long term (current) drug therapy: Secondary | ICD-10-CM | POA: Diagnosis not present

## 2023-10-31 DIAGNOSIS — C499 Malignant neoplasm of connective and soft tissue, unspecified: Secondary | ICD-10-CM | POA: Diagnosis not present

## 2023-10-31 DIAGNOSIS — C7802 Secondary malignant neoplasm of left lung: Secondary | ICD-10-CM | POA: Diagnosis not present

## 2023-10-31 DIAGNOSIS — I82220 Acute embolism and thrombosis of inferior vena cava: Secondary | ICD-10-CM | POA: Diagnosis not present

## 2023-10-31 DIAGNOSIS — R911 Solitary pulmonary nodule: Secondary | ICD-10-CM | POA: Diagnosis not present

## 2023-10-31 DIAGNOSIS — I82421 Acute embolism and thrombosis of right iliac vein: Secondary | ICD-10-CM | POA: Diagnosis not present

## 2023-11-01 DIAGNOSIS — C499 Malignant neoplasm of connective and soft tissue, unspecified: Secondary | ICD-10-CM | POA: Diagnosis not present

## 2023-11-01 DIAGNOSIS — C61 Malignant neoplasm of prostate: Secondary | ICD-10-CM | POA: Diagnosis not present

## 2023-11-01 DIAGNOSIS — C7802 Secondary malignant neoplasm of left lung: Secondary | ICD-10-CM | POA: Diagnosis not present

## 2024-01-15 ENCOUNTER — Encounter: Payer: Self-pay | Admitting: Physician Assistant

## 2024-01-15 ENCOUNTER — Ambulatory Visit: Payer: Self-pay | Admitting: Physician Assistant

## 2024-01-15 VITALS — BP 144/90 | Temp 97.6°F | Resp 12 | Ht 73.0 in | Wt 210.0 lb

## 2024-01-15 DIAGNOSIS — J0100 Acute maxillary sinusitis, unspecified: Secondary | ICD-10-CM

## 2024-01-15 MED ORDER — AMOXICILLIN 875 MG PO TABS: 875.0000 mg | ORAL_TABLET | Freq: Two times a day (BID) | ORAL | 0 refills | Status: AC

## 2024-01-15 MED ORDER — FEXOFENADINE-PSEUDOEPHED ER 60-120 MG PO TB12: 1.0000 | ORAL_TABLET | Freq: Two times a day (BID) | ORAL | 0 refills | Status: AC

## 2024-01-15 MED ORDER — BENZONATATE 200 MG PO CAPS: 200.0000 mg | ORAL_CAPSULE | Freq: Three times a day (TID) | ORAL | 0 refills | Status: AC | PRN

## 2024-01-15 NOTE — Progress Notes (Signed)
   Subjective: Cough and congestion    Patient ID: Vernon Wade, male    DOB: 08-03-1971, 53 y.o.   MRN: [id number]  HPI Patient complain of cough and congestion x 1 week.  States sinus and chest congestion.  Cough is worse at night when trying to go to sleep.  No palliative measure for complaint.   Review of Systems   Seasonal rhinitis Objective:   Physical Exam  01/15/2024   2:24 PM    BP 144/90BP. 144/90. Data is abnormal. Taken on 01/15/24 2:24 PM  Cuff Size Normal  Temp 97.6 F (36.4 C)  Temp Source Temporal  Weight 210 lb (95.3 kg)  Height 6\' 1"  (1.854 m)  Resp 12  SpO2 100 %  No acute distress. HEENT is remarkable for bilateral maxillary sinus guarding with palpation.  Edematous nasal turbinates. Neck is supple for lymphadenopathy or bruits. Lungs are clear to auscultation. Heart regular rate and rhythm.       Assessment & Plan: Subacute maxillary sinusitis with postnasal drainage.  Patient given prescription for amoxicillin , Allegra-D, Tessalon  Perles.  Advised to follow-up with improvement or worsening complaints in 1 week.

## 2024-01-15 NOTE — Progress Notes (Signed)
 Pt presents today with congestion, sinus pain and pressure, chest congestion, on and off sore throat and coughing. Worse at night.

## 2024-01-31 ENCOUNTER — Encounter: Payer: 59 | Admitting: Physician Assistant

## 2024-02-07 DIAGNOSIS — I82220 Acute embolism and thrombosis of inferior vena cava: Secondary | ICD-10-CM | POA: Diagnosis not present

## 2024-02-07 DIAGNOSIS — I82421 Acute embolism and thrombosis of right iliac vein: Secondary | ICD-10-CM | POA: Diagnosis not present

## 2024-02-07 DIAGNOSIS — C7802 Secondary malignant neoplasm of left lung: Secondary | ICD-10-CM | POA: Diagnosis not present

## 2024-02-07 DIAGNOSIS — C499 Malignant neoplasm of connective and soft tissue, unspecified: Secondary | ICD-10-CM | POA: Diagnosis not present

## 2024-02-07 DIAGNOSIS — C61 Malignant neoplasm of prostate: Secondary | ICD-10-CM | POA: Diagnosis not present

## 2024-02-12 ENCOUNTER — Ambulatory Visit: Payer: Self-pay

## 2024-02-12 DIAGNOSIS — Z0000 Encounter for general adult medical examination without abnormal findings: Secondary | ICD-10-CM

## 2024-02-12 LAB — POCT URINALYSIS DIPSTICK
Bilirubin, UA: NEGATIVE
Blood, UA: NEGATIVE
Glucose, UA: NEGATIVE
Ketones, UA: NEGATIVE
Leukocytes, UA: NEGATIVE
Nitrite, UA: NEGATIVE
Protein, UA: NEGATIVE
Spec Grav, UA: 1.01 (ref 1.010–1.025)
Urobilinogen, UA: 0.2 U/dL
pH, UA: 6 (ref 5.0–8.0)

## 2024-02-12 NOTE — Progress Notes (Signed)
 Pt completed labs and uA for physical./CL,RMA

## 2024-02-13 LAB — CMP12+LP+TP+TSH+6AC+PSA+CBC…
ALT: 10 IU/L (ref 0–44)
AST: 22 IU/L (ref 0–40)
Albumin: 4.4 g/dL (ref 4.1–5.1)
Alkaline Phosphatase: 59 IU/L (ref 44–121)
BUN/Creatinine Ratio: 14 (ref 9–20)
BUN: 14 mg/dL (ref 6–24)
Basophils Absolute: 0.1 10*3/uL (ref 0.0–0.2)
Basos: 1 %
Bilirubin Total: 0.4 mg/dL (ref 0.0–1.2)
Calcium: 9.1 mg/dL (ref 8.7–10.2)
Chloride: 104 mmol/L (ref 96–106)
Chol/HDL Ratio: 2.2 ratio (ref 0.0–5.0)
Cholesterol, Total: 162 mg/dL (ref 100–199)
Creatinine, Ser: 1.02 mg/dL (ref 0.76–1.27)
EOS (ABSOLUTE): 0.2 10*3/uL (ref 0.0–0.4)
Eos: 4 %
Estimated CHD Risk: <0.5 times avg. (ref 0.0–1.0)
Free Thyroxine Index: 2.1 (ref 1.2–4.9)
GGT: 15 IU/L (ref 0–65)
Globulin, Total: 1.9 g/dL (ref 1.5–4.5)
Glucose: 76 mg/dL (ref 70–99)
HDL: 74 mg/dL (ref >39)
Hematocrit: 39.6 % (ref 37.5–51.0)
Hemoglobin: 13.5 g/dL (ref 13.0–17.7)
Immature Grans (Abs): 0 10*3/uL (ref 0.0–0.1)
Immature Granulocytes: 0 %
Iron: 82 ug/dL (ref 38–169)
LDH: 194 IU/L (ref 121–224)
LDL Chol Calc (NIH): 77 mg/dL (ref 0–99)
Lymphocytes Absolute: 0.8 10*3/uL (ref 0.7–3.1)
Lymphs: 22 %
MCH: 33.2 pg — ABNORMAL HIGH (ref 26.6–33.0)
MCHC: 34.1 g/dL (ref 31.5–35.7)
MCV: 97 fL (ref 79–97)
Monocytes Absolute: 0.3 10*3/uL (ref 0.1–0.9)
Monocytes: 8 %
Neutrophils Absolute: 2.2 10*3/uL (ref 1.4–7.0)
Neutrophils: 65 %
Phosphorus: 3.4 mg/dL (ref 2.8–4.1)
Platelets: 201 10*3/uL (ref 150–450)
Potassium: 4 mmol/L (ref 3.5–5.2)
Prostate Specific Ag, Serum: 0.1 ng/mL (ref 0.0–4.0)
RBC: 4.07 x10E6/uL — ABNORMAL LOW (ref 4.14–5.80)
RDW: 13.1 % (ref 11.6–15.4)
Sodium: 143 mmol/L (ref 134–144)
T3 Uptake Ratio: 26 % (ref 24–39)
T4, Total: 7.9 ug/dL (ref 4.5–12.0)
TSH: 3.26 u[IU]/mL (ref 0.450–4.500)
Total Protein: 6.3 g/dL (ref 6.0–8.5)
Triglycerides: 51 mg/dL (ref 0–149)
Uric Acid: 6.3 mg/dL (ref 3.8–8.4)
VLDL Cholesterol Cal: 11 mg/dL (ref 5–40)
WBC: 3.5 10*3/uL (ref 3.4–10.8)
eGFR: 90 mL/min/{1.73_m2} (ref >59)

## 2024-02-19 ENCOUNTER — Encounter: Payer: 59 | Admitting: Physician Assistant

## 2024-02-21 ENCOUNTER — Encounter: Payer: Self-pay | Admitting: Physician Assistant

## 2024-02-21 ENCOUNTER — Ambulatory Visit: Payer: Self-pay | Admitting: Physician Assistant

## 2024-02-21 VITALS — BP 111/66 | HR 79 | Temp 97.6°F | Resp 14 | Ht 73.0 in | Wt 213.0 lb

## 2024-02-21 DIAGNOSIS — Z0000 Encounter for general adult medical examination without abnormal findings: Secondary | ICD-10-CM

## 2024-02-21 MED ORDER — MELOXICAM 15 MG PO TABS: 15.0000 mg | ORAL_TABLET | Freq: Every day | ORAL | 5 refills | Status: DC

## 2024-02-21 MED ORDER — SILDENAFIL CITRATE 50 MG PO TABS: 50.0000 mg | ORAL_TABLET | Freq: Every day | ORAL | 5 refills | Status: AC | PRN

## 2024-02-21 NOTE — Progress Notes (Signed)
 [facility] Emergency Department Provider Note  ____________________________________________   None    (approximate)  I have reviewed the triage vital signs and the nursing notes.   HISTORY  Chief Complaint Annual Exam   HPI Vernon Wade is a 53 y.o. male presents annual physical exam.  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 Authorizing Provider  buPROPion (WELLBUTRIN XL) 150 MG 24 hr tablet Take 1 tablet by mouth daily. 10/15/20  Yes [provider]  enoxaparin (LOVENOX) 150 MG/ML injection Inject 150 mg into the skin daily. 10/02/22 12/14/24 Yes [provider]  furosemide (LASIX) 20 MG tablet Take 20 mg by mouth once. 01/06/21  Yes [provider]  gabapentin (NEURONTIN) 300 MG capsule Take 600 mg by mouth 3 (three) times daily.   Yes [provider]  loratadine (CLARITIN) 10 MG tablet Take 1 tablet by mouth daily.   Yes [provider]  Multiple Vitamin (MULTI-VITAMIN) tablet Take 1 tablet by mouth daily.   Yes [provider]  Omega-3 Fatty Acids (FISH OIL) 1000 MG CAPS Take 1 capsule by mouth daily.   Yes [provider]   potassium chloride SA (KLOR-CON M) 20 MEQ tablet Take 20 mEq by mouth daily.   Yes [provider]  traZODone (DESYREL) 50 MG tablet Take 100 mg by mouth daily.   Yes [provider]  albuterol (VENTOLIN HFA) 108 (90 Base) MCG/ACT inhaler Inhale into the lungs. Patient not taking: Reported on 02/21/2024 06/05/20   [provider]  benzonatate (TESSALON) 200 MG capsule Take 1 capsule (200 mg total) by mouth 3 (three) times daily as needed for cough. Patient not taking: Reported on 02/21/2024 01/15/24   Joni Reining, PA-C  diphenoxylate-atropine (LOMOTIL) 2.5-0.025 MG tablet Take 2 tablets by mouth 4 (four) times daily as needed for diarrhea or loose stools. Patient not taking: Reported on 02/21/2024 01/17/22   [provider]  fexofenadine-pseudoephedrine (ALLEGRA-D) 60-120 MG 12 hr tablet Take 1 tablet by mouth 2 (two) times daily. Patient not taking: Reported on 02/21/2024 01/15/24   Joni Reining, PA-C  pazopanib (VOTRIENT) 200 MG tablet Take by mouth. Patient not taking: Reported on 02/21/2024    [provider]  prochlorperazine (COMPAZINE) 10 MG tablet Take 10 mg by mouth every 6 (six) hours as needed. Patient not taking: Reported on 02/21/2024 02/23/21   [provider]    Allergies Codeine  Family History  Problem Relation Age of Onset   Hypertension Maternal Grandmother    Hypertension Paternal Grandmother     Social History Social History   Tobacco Use   Smoking status: Never  Smokeless tobacco: Never  Substance Use Topics   Alcohol use: Yes    Review of Systems  Constitutional: No fever/chills Eyes: No visual changes. ENT: No sore throat. Cardiovascular: Denies chest pain. Respiratory: Denies shortness of breath.  Asthma. Gastrointestinal: No abdominal pain.  No nausea, no vomiting.  No diarrhea.  No constipation. Genitourinary: Negative for dysuria.  Erectile dysfunction Musculoskeletal: Positive for back  pain. Skin: Negative for rash. Neurological: Negative for headaches, focal weakness or numbness. Psychiatric: Anxiety/depression. Allergic/Immunilogical: Codeine. ____________________________________________   PHYSICAL EXAM:  VITAL SIGNS:  02/21/2024   9:55 AM    BP 111/66  Cuff Size Large  Pulse Rate 79  Temp 97.6 F (36.4 C)  Temp Source Temporal  Weight 213 lb (96.6 kg)  Height 6\' 1"  (1.854 m)  Resp 14  SpO2 100 %   Other Vitals   BMI: 28.10 kg/m2  BSA: 2.23 m2   Constitutional: Alert and oriented. Well appearing and in no acute distress. Eyes: Conjunctivae are normal. PERRL. EOMI. Head: Atraumatic. Nose: No congestion/rhinnorhea. Mouth/Throat: Mucous membranes are moist.  Oropharynx non-erythematous. Neck: No stridor.  No cervical spine tenderness to palpation. Hematological/Lymphatic/Immunilogical: No cervical lymphadenopathy. Cardiovascular: Normal rate, regular rhythm. Grossly normal heart sounds.  Good peripheral circulation. Respiratory: Normal respiratory effort.  No retractions. Lungs CTAB. Gastrointestinal: Soft and nontender. No distention. No abdominal bruits. No CVA tenderness. Genitourinary: Deferred Musculoskeletal: No lower extremity tenderness nor edema.  No joint effusions. Neurologic:  Normal speech and language. No gross focal neurologic deficits are appreciated. No gait instability. Skin:  Skin is warm, dry and intact. No rash noted. Psychiatric: Mood and affect are normal. Speech and behavior are normal.  ____________________________________________   LABS _     Component Ref Range & Units (hover) 9 d ago 3 yr ago  Color, UA yellow Yellow  Clarity, UA clear Clear  Glucose, UA Negative Negative  Bilirubin, UA neg Negative  Ketones, UA neg Negative  Spec Grav, UA 1.010 1.010 VC  Blood, UA neg Negative  pH, UA 6.0 7.5 VC  Protein, UA Negative Negative  Urobilinogen, UA 0.2 0.2  Nitrite, UA neg Negative  Leukocytes, UA Negative  Negative  Appearance    Odor               View All Conversations on this Encounter            Component Ref Range & Units (hover) 9 d ago (02/12/24) 3 yr ago (01/14/21) 3 yr ago (01/14/21) 3 yr ago (05/19/20)  Glucose 76   81 R  Uric Acid 6.3   6.3 CM  Comment:            Therapeutic target for gout patients: <6.0  BUN 14   12  Creatinine, Ser 1.02   1.02  eGFR 90     BUN/Creatinine Ratio 14   12  Sodium 143 138  141  Potassium 4.0 3.9  4.5  Chloride 104 103  105  Calcium 9.1   9.5  Phosphorus 3.4   2.6 Low   Total Protein 6.3   6.6  Albumin 4.4   4.5 R  Globulin, Total 1.9   2.1  Bilirubin Total 0.4   0.5  Alkaline Phosphatase 59   71 R  LDH 194   201  AST 22   20  ALT 10   13  GGT 15   39  Iron 82   73  Cholesterol, Total 162   153  Triglycerides 51  63  HDL 74   61  VLDL Cholesterol Cal 11   13  LDL Chol Calc (NIH) 77   79  Chol/HDL Ratio 2.2   2.5 CM  Comment:                                   T. Chol/HDL Ratio                                             Men  Women                               1/2 Avg.Risk  3.4    3.3                                   Avg.Risk  5.0    4.4                                2X Avg.Risk  9.6    7.1                                3X Avg.Risk 23.4   11.0  Estimated CHD Risk  < 0.5    < 0.5 CM  Comment: The CHD Risk is based on the T. Chol/HDL ratio. Other factors affect CHD Risk such as hypertension, smoking, diabetes, severe obesity, and family history of premature CHD.  TSH 3.260   2.030  T4, Total 7.9   7.8  T3 Uptake Ratio 26   27  Free Thyroxine Index 2.1   2.1  Prostate Specific Ag, Serum 0.1  <0.1 CM 0.2 CM  Comment: Roche ECLIA methodology. According to the American U[employer] Association, Serum PSA should decrease and remain at undetectable levels after radical prostatectomy. The AUA defines biochemical recurrence as an initial PSA value 0.2 ng/mL or greater followed by a subsequent confirmatory PSA value 0.2  ng/mL or greater. Values obtained with different assay methods or kits cannot be used interchangeably. Results cannot be interpreted as absolute evidence of the presence or absence of malignant disease.  WBC 3.5   5.6  RBC 4.07 Low    4.93  Hemoglobin 13.5   15.5  Hematocrit 39.6   44.6  MCV 97   91  MCH 33.2 High    31.4  MCHC 34.1   34.8  RDW 13.1   13.0  Platelets 201   255  Neutrophils 65   59  Lymphs 22   28  Monocytes 8   8  Eos 4   4  Basos 1   1  Neutrophils Absolute 2.2   3.3  Lymphocytes Absolute 0.8   1.6  Monocytes Absolute 0.3   0.5  EOS (ABSOLUTE) 0.2   0.2  Basophils Absolute 0.1   0.1  Immature Granulocytes 0   0  Immature Grans (Abs) 0.0   0.0          ___________________________________________  EKG  Normal sinus rhythm at 75 bpm ____________________________________________    ____________________________________________   INITIAL IMPRESSION / ASSESSMENT  AND PLAN  As part of my medical decision making, I reviewed the following data within the electronic MEDICAL RECORD NUMBER       No acute findings on physical exam, EKG, or labs.  Patient given a prescription for Mobic to treat low back pain and Viagra for erectile dysfunction.     ____________________________________________   FINAL CLINICAL IMPRESSION(S) / ED DIAGNOSES  Well exam   ED Discharge Orders     None        Note:  This document was prepared using Dragon voice recognition software and may include unintentional dictation errors.

## 2024-02-21 NOTE — Progress Notes (Signed)
 Pt presents today to complete physical, Pt denies any issues or concern at this time. Gretel Acre

## 2024-05-06 DIAGNOSIS — C61 Malignant neoplasm of prostate: Secondary | ICD-10-CM | POA: Diagnosis not present

## 2024-05-06 DIAGNOSIS — C495 Malignant neoplasm of connective and soft tissue of pelvis: Secondary | ICD-10-CM | POA: Diagnosis not present

## 2024-05-06 DIAGNOSIS — C7802 Secondary malignant neoplasm of left lung: Secondary | ICD-10-CM | POA: Diagnosis not present

## 2024-05-06 DIAGNOSIS — J45909 Unspecified asthma, uncomplicated: Secondary | ICD-10-CM | POA: Diagnosis not present

## 2024-05-06 DIAGNOSIS — K627 Radiation proctitis: Secondary | ICD-10-CM | POA: Diagnosis not present

## 2024-05-06 DIAGNOSIS — C499 Malignant neoplasm of connective and soft tissue, unspecified: Secondary | ICD-10-CM | POA: Diagnosis not present

## 2024-05-06 DIAGNOSIS — Z79899 Other long term (current) drug therapy: Secondary | ICD-10-CM | POA: Diagnosis not present

## 2024-05-06 DIAGNOSIS — K3189 Other diseases of stomach and duodenum: Secondary | ICD-10-CM | POA: Diagnosis not present

## 2024-05-06 DIAGNOSIS — K625 Hemorrhage of anus and rectum: Secondary | ICD-10-CM | POA: Diagnosis not present

## 2024-05-06 DIAGNOSIS — Z923 Personal history of irradiation: Secondary | ICD-10-CM | POA: Diagnosis not present

## 2024-05-06 DIAGNOSIS — K922 Gastrointestinal hemorrhage, unspecified: Secondary | ICD-10-CM | POA: Diagnosis not present

## 2024-05-06 DIAGNOSIS — D120 Benign neoplasm of cecum: Secondary | ICD-10-CM | POA: Diagnosis not present

## 2024-05-06 DIAGNOSIS — Z9109 Other allergy status, other than to drugs and biological substances: Secondary | ICD-10-CM | POA: Diagnosis not present

## 2024-05-06 DIAGNOSIS — Z7901 Long term (current) use of anticoagulants: Secondary | ICD-10-CM | POA: Diagnosis not present

## 2024-05-06 DIAGNOSIS — Z885 Allergy status to narcotic agent status: Secondary | ICD-10-CM | POA: Diagnosis not present

## 2024-05-06 DIAGNOSIS — G4700 Insomnia, unspecified: Secondary | ICD-10-CM | POA: Diagnosis not present

## 2024-05-07 DIAGNOSIS — C61 Malignant neoplasm of prostate: Secondary | ICD-10-CM | POA: Diagnosis not present

## 2024-05-07 DIAGNOSIS — I8290 Acute embolism and thrombosis of unspecified vein: Secondary | ICD-10-CM | POA: Insufficient documentation

## 2024-05-07 DIAGNOSIS — K625 Hemorrhage of anus and rectum: Secondary | ICD-10-CM | POA: Diagnosis not present

## 2024-05-07 DIAGNOSIS — K922 Gastrointestinal hemorrhage, unspecified: Secondary | ICD-10-CM | POA: Insufficient documentation

## 2024-05-07 DIAGNOSIS — C499 Malignant neoplasm of connective and soft tissue, unspecified: Secondary | ICD-10-CM | POA: Diagnosis not present

## 2024-05-08 DIAGNOSIS — K625 Hemorrhage of anus and rectum: Secondary | ICD-10-CM | POA: Diagnosis not present

## 2024-05-09 DIAGNOSIS — K6289 Other specified diseases of anus and rectum: Secondary | ICD-10-CM | POA: Diagnosis not present

## 2024-05-09 DIAGNOSIS — K922 Gastrointestinal hemorrhage, unspecified: Secondary | ICD-10-CM | POA: Diagnosis not present

## 2024-05-09 DIAGNOSIS — C61 Malignant neoplasm of prostate: Secondary | ICD-10-CM | POA: Diagnosis not present

## 2024-05-09 DIAGNOSIS — C499 Malignant neoplasm of connective and soft tissue, unspecified: Secondary | ICD-10-CM | POA: Diagnosis not present

## 2024-05-09 DIAGNOSIS — K625 Hemorrhage of anus and rectum: Secondary | ICD-10-CM | POA: Diagnosis not present

## 2024-05-09 DIAGNOSIS — K921 Melena: Secondary | ICD-10-CM | POA: Diagnosis not present

## 2024-05-09 DIAGNOSIS — D120 Benign neoplasm of cecum: Secondary | ICD-10-CM | POA: Diagnosis not present

## 2024-05-10 DIAGNOSIS — K921 Melena: Secondary | ICD-10-CM | POA: Diagnosis not present

## 2024-05-10 DIAGNOSIS — K625 Hemorrhage of anus and rectum: Secondary | ICD-10-CM | POA: Diagnosis not present

## 2024-05-16 DIAGNOSIS — C61 Malignant neoplasm of prostate: Secondary | ICD-10-CM | POA: Diagnosis not present

## 2024-05-16 DIAGNOSIS — C499 Malignant neoplasm of connective and soft tissue, unspecified: Secondary | ICD-10-CM | POA: Diagnosis not present

## 2024-05-16 DIAGNOSIS — C7802 Secondary malignant neoplasm of left lung: Secondary | ICD-10-CM | POA: Diagnosis not present

## 2024-05-16 DIAGNOSIS — Z79899 Other long term (current) drug therapy: Secondary | ICD-10-CM | POA: Diagnosis not present

## 2024-06-13 DIAGNOSIS — H5213 Myopia, bilateral: Secondary | ICD-10-CM | POA: Diagnosis not present

## 2024-06-13 DIAGNOSIS — H524 Presbyopia: Secondary | ICD-10-CM | POA: Diagnosis not present

## 2024-06-13 DIAGNOSIS — H52223 Regular astigmatism, bilateral: Secondary | ICD-10-CM | POA: Diagnosis not present

## 2024-08-29 DIAGNOSIS — I82421 Acute embolism and thrombosis of right iliac vein: Secondary | ICD-10-CM | POA: Diagnosis not present

## 2024-08-29 DIAGNOSIS — C61 Malignant neoplasm of prostate: Secondary | ICD-10-CM | POA: Diagnosis not present

## 2024-08-29 DIAGNOSIS — C499 Malignant neoplasm of connective and soft tissue, unspecified: Secondary | ICD-10-CM | POA: Diagnosis not present

## 2024-08-29 DIAGNOSIS — I82220 Acute embolism and thrombosis of inferior vena cava: Secondary | ICD-10-CM | POA: Diagnosis not present

## 2024-08-29 DIAGNOSIS — C7802 Secondary malignant neoplasm of left lung: Secondary | ICD-10-CM | POA: Diagnosis not present

## 2024-08-31 ENCOUNTER — Other Ambulatory Visit: Payer: Self-pay | Admitting: Physician Assistant

## 2025-01-02 ENCOUNTER — Ambulatory Visit (INDEPENDENT_AMBULATORY_CARE_PROVIDER_SITE_OTHER): Admitting: Vascular Surgery

## 2025-01-02 ENCOUNTER — Encounter (INDEPENDENT_AMBULATORY_CARE_PROVIDER_SITE_OTHER): Payer: Self-pay | Admitting: Vascular Surgery

## 2025-01-02 VITALS — BP 120/82 | HR 86 | Resp 15 | Ht 73.0 in | Wt 221.0 lb

## 2025-01-02 DIAGNOSIS — I8310 Varicose veins of unspecified lower extremity with inflammation: Secondary | ICD-10-CM | POA: Diagnosis not present

## 2025-01-02 DIAGNOSIS — C7802 Secondary malignant neoplasm of left lung: Secondary | ICD-10-CM | POA: Diagnosis not present

## 2025-01-02 DIAGNOSIS — I82521 Chronic embolism and thrombosis of right iliac vein: Secondary | ICD-10-CM | POA: Diagnosis not present

## 2025-01-03 DIAGNOSIS — I82521 Chronic embolism and thrombosis of right iliac vein: Secondary | ICD-10-CM | POA: Insufficient documentation

## 2025-01-08 ENCOUNTER — Ambulatory Visit: Payer: Self-pay

## 2025-01-08 DIAGNOSIS — Z0000 Encounter for general adult medical examination without abnormal findings: Secondary | ICD-10-CM

## 2025-01-08 LAB — POCT URINALYSIS DIPSTICK
Bilirubin, UA: NEGATIVE
Blood, UA: NEGATIVE
Glucose, UA: NEGATIVE
Ketones, UA: NEGATIVE
Leukocytes, UA: NEGATIVE
Nitrite, UA: NEGATIVE
Protein, UA: NEGATIVE
Spec Grav, UA: 1.01
Urobilinogen, UA: 0.2 U/dL
pH, UA: 6.5

## 2025-01-09 LAB — CMP12+LP+TP+TSH+6AC+PSA+CBC…
ALT: 11 [IU]/L (ref 0–44)
AST: 17 [IU]/L (ref 0–40)
Albumin: 4.6 g/dL (ref 3.8–4.9)
Alkaline Phosphatase: 62 [IU]/L (ref 47–123)
BUN/Creatinine Ratio: 9 (ref 9–20)
BUN: 10 mg/dL (ref 6–24)
Basophils Absolute: 0 10*3/uL (ref 0.0–0.2)
Basos: 1 %
Bilirubin Total: 0.7 mg/dL (ref 0.0–1.2)
Calcium: 9.2 mg/dL (ref 8.7–10.2)
Chloride: 101 mmol/L (ref 96–106)
Chol/HDL Ratio: 2.1 ratio (ref 0.0–5.0)
Cholesterol, Total: 142 mg/dL (ref 100–199)
Creatinine, Ser: 1.13 mg/dL (ref 0.76–1.27)
EOS (ABSOLUTE): 0.2 10*3/uL (ref 0.0–0.4)
Eos: 5 %
Free Thyroxine Index: 3.2 (ref 1.2–4.9)
GGT: 13 [IU]/L (ref 0–65)
Globulin, Total: 1.6 g/dL (ref 1.5–4.5)
Glucose: 73 mg/dL (ref 70–99)
HDL: 67 mg/dL
Hematocrit: 43.8 % (ref 37.5–51.0)
Hemoglobin: 15.2 g/dL (ref 13.0–17.7)
Immature Grans (Abs): 0 10*3/uL (ref 0.0–0.1)
Immature Granulocytes: 0 %
Iron: 103 ug/dL (ref 38–169)
LDH: 169 [IU]/L (ref 121–224)
LDL Chol Calc (NIH): 63 mg/dL (ref 0–99)
Lymphocytes Absolute: 0.7 10*3/uL (ref 0.7–3.1)
Lymphs: 16 %
MCH: 33.2 pg — ABNORMAL HIGH (ref 26.6–33.0)
MCHC: 34.7 g/dL (ref 31.5–35.7)
MCV: 96 fL (ref 79–97)
Monocytes Absolute: 0.4 10*3/uL (ref 0.1–0.9)
Monocytes: 9 %
Neutrophils Absolute: 3.1 10*3/uL (ref 1.4–7.0)
Neutrophils: 69 %
Phosphorus: 2.9 mg/dL (ref 2.8–4.1)
Platelets: 232 10*3/uL (ref 150–450)
Potassium: 3.9 mmol/L (ref 3.5–5.2)
Prostate Specific Ag, Serum: 0.3 ng/mL (ref 0.0–4.0)
RBC: 4.58 x10E6/uL (ref 4.14–5.80)
RDW: 12.6 % (ref 11.6–15.4)
Sodium: 140 mmol/L (ref 134–144)
T3 Uptake Ratio: 31 % (ref 24–39)
T4, Total: 10.2 ug/dL (ref 4.5–12.0)
TSH: 2.66 u[IU]/mL (ref 0.450–4.500)
Total Protein: 6.2 g/dL (ref 6.0–8.5)
Triglycerides: 55 mg/dL (ref 0–149)
Uric Acid: 5.6 mg/dL (ref 3.8–8.4)
VLDL Cholesterol Cal: 12 mg/dL (ref 5–40)
WBC: 4.5 10*3/uL (ref 3.4–10.8)
eGFR: 79 mL/min/{1.73_m2}

## 2025-01-15 ENCOUNTER — Encounter: Admitting: Physician Assistant

## 2025-01-30 ENCOUNTER — Encounter (INDEPENDENT_AMBULATORY_CARE_PROVIDER_SITE_OTHER)

## 2025-01-30 ENCOUNTER — Ambulatory Visit (INDEPENDENT_AMBULATORY_CARE_PROVIDER_SITE_OTHER): Admitting: Vascular Surgery
